# Patient Record
Sex: Male | Born: 1990 | Hispanic: No | Marital: Married | Smoking: Never smoker
Health system: Southern US, Community
[De-identification: ages and names within clinical notes are randomized; demographics above are authoritative.]

---
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
## 2012-07-21 ENCOUNTER — Encounter (HOSPITAL_COMMUNITY): Payer: Self-pay | Admitting: Emergency Medicine

## 2012-07-21 ENCOUNTER — Emergency Department (HOSPITAL_COMMUNITY)
Admission: EM | Admit: 2012-07-21 | Discharge: 2012-07-21 | Disposition: A | Discharge status: Home / Self Care | Payer: Medicaid Other | Source: Home / Self Care | Attending: Emergency Medicine | Admitting: Emergency Medicine

## 2012-07-21 DIAGNOSIS — L03039 Cellulitis of unspecified toe: Secondary | ICD-10-CM

## 2012-07-21 DIAGNOSIS — L03032 Cellulitis of left toe: Secondary | ICD-10-CM

## 2012-07-21 MED ORDER — DOXYCYCLINE HYCLATE 100 MG PO CAPS: 100.0000 mg | ORAL_CAPSULE | Freq: Two times a day (BID) | ORAL | Status: AC

## 2012-07-21 NOTE — ED Notes (Signed)
Pt c/o infection of great left toe x 2 days. Pain with wear shoes. Pt states was wearing tight shoes to work and pain started.  Pt has been cleaning toe and using lotion.

## 2012-07-21 NOTE — ED Provider Notes (Addendum)
History     CSN: [id number]  Arrival date & time 07/21/12  1633   First MD Initiated Contact with Patient 07/21/12 1800      Chief Complaint  Patient presents with  . Toe Pain    infection of left great toe. some discoloration. denies drainage. pt has used lotion and keeping area clean    (Consider location/radiation/quality/duration/timing/severity/associated sxs/prior treatment) HPI Comments: Patient presents urgent care describing that after having were some tight shoes started developing pain redness tenderness of his great left toe appeared in the last 2-3 days. Since then he has kept his toe out without any close to where. Has seen some purulent type discharge from the edge of his left thumb. Hurts to walk on it and touch appeared has been cleaning it is much as he can't and using a cream or lotion doesn't seem to be getting any better.  Patient is a 22 y.o. male presenting with toe pain. The history is provided by the patient. The history is limited by a language barrier. A language interpreter was used.  Toe Pain This is a new problem. The current episode started 2 days ago. The problem occurs constantly. The problem has not changed since onset.Pertinent negatives include no chest pain and no abdominal pain. The symptoms are aggravated by bending and walking. The symptoms are relieved by rest. He has tried nothing (cleaning area and using lotion) for the symptoms. The treatment provided no relief.    History reviewed. No pertinent past medical history.  Past Surgical History  Procedure Laterality Date  . Tonsillectomy    . Foot surgery      History reviewed. No pertinent family history.  History  Substance Use Topics  . Smoking status: Not on file  . Smokeless tobacco: Not on file  . Alcohol Use: No      Review of Systems  Constitutional: Negative for fever, chills, activity change and appetite change.  Cardiovascular: Negative for chest pain.  Gastrointestinal:  Negative for abdominal pain.  Skin: Positive for color change. Negative for pallor, rash and wound.  Neurological: Negative for weakness and numbness.    Allergies  Review of patient's allergies indicates no known allergies.  Home Medications   Current Outpatient Rx  Name  Route  Sig  Dispense  Refill  . doxycycline (VIBRAMYCIN) 100 MG capsule   Oral   Take 1 capsule (100 mg total) by mouth 2 (two) times daily.   14 capsule   0     BP 117/98  Pulse 60  Temp(Src) 97.8 F (36.6 C) (Oral)  Resp 18  SpO2 100%  Physical Exam  Constitutional: He appears well-developed and well-nourished.  Musculoskeletal: He exhibits tenderness.       Feet:  Neurological: He is alert.  Skin: No rash noted. There is erythema.    ED Course  Procedures (including critical care time)  Labs Reviewed - No data to display No results found.   1. Paronychia of great toe, left       MDM  Toe paronychia- Post-op shoe- Frequent soaks- of affected toe in soap andy-luke water- Doxycycline course 7 days To sue post-op shoe for 3 days.Podiatrist referral if discomfort persist after 7 days         Jimmie Molly, MD 07/21/12 1827  Jimmie Molly, MD 07/21/12 [phone]

## 2012-10-06 ENCOUNTER — Emergency Department (HOSPITAL_COMMUNITY)
Admission: EM | Admit: 2012-10-06 | Discharge: 2012-10-06 | Disposition: A | Discharge status: Home / Self Care | Payer: Medicaid Other | Source: Home / Self Care | Attending: Family Medicine | Admitting: Family Medicine

## 2012-10-06 ENCOUNTER — Encounter (HOSPITAL_COMMUNITY): Payer: Self-pay | Admitting: *Deleted

## 2012-10-06 DIAGNOSIS — J302 Other seasonal allergic rhinitis: Secondary | ICD-10-CM

## 2012-10-06 DIAGNOSIS — J309 Allergic rhinitis, unspecified: Secondary | ICD-10-CM

## 2012-10-06 MED ORDER — FLUTICASONE PROPIONATE 50 MCG/ACT NA SUSP: 1.0000 | Freq: Two times a day (BID) | NASAL | Status: DC

## 2012-10-06 MED ORDER — PREDNISONE 50 MG PO TABS: ORAL_TABLET | ORAL | Status: DC

## 2012-10-06 MED ORDER — TRIAMCINOLONE ACETONIDE 40 MG/ML IJ SUSP
40.0000 mg | Freq: Once | INTRAMUSCULAR | Status: AC
  Administered 2012-10-06: 40 mg via INTRAMUSCULAR

## 2012-10-06 MED ORDER — TRIAMCINOLONE ACETONIDE 40 MG/ML IJ SUSP
INTRAMUSCULAR | Status: AC
  Filled 2012-10-06: qty 1

## 2012-10-06 MED ORDER — FEXOFENADINE HCL 180 MG PO TABS: 180.0000 mg | ORAL_TABLET | Freq: Every day | ORAL | Status: DC

## 2012-10-06 NOTE — ED Provider Notes (Signed)
History    CSN: [id number] Arrival date & time 10/06/12  1619  First MD Initiated Contact with Patient 10/06/12 1631     Chief Complaint  Patient presents with  . Facial Pain   (Consider location/radiation/quality/duration/timing/severity/associated sxs/prior Treatment) Patient is a 22 y.o. male presenting with URI. The history is provided by the patient.  URI Presenting symptoms: congestion, facial pain and rhinorrhea   Presenting symptoms: no fever   Severity:  Moderate Onset quality:  Gradual Duration:  12 months Progression:  Worsening (in [place] from [place] for 6 mo.) Chronicity:  New Relieved by:  Nothing Associated symptoms: headaches    History reviewed. No pertinent past medical history. Past Surgical History  Procedure Laterality Date  . Tonsillectomy    . Foot surgery     History reviewed. No pertinent family history. History  Substance Use Topics  . Smoking status: Never Smoker   . Smokeless tobacco: Not on file  . Alcohol Use: No    Review of Systems  Constitutional: Negative.  Negative for fever.  HENT: Positive for nosebleeds, congestion, rhinorrhea and postnasal drip.   Respiratory: Positive for shortness of breath. Negative for chest tightness.   Cardiovascular: Negative.   Neurological: Positive for headaches.    Allergies  Review of patient's allergies indicates no known allergies.  Home Medications   Current Outpatient Rx  Name  Route  Sig  Dispense  Refill  . diphenhydrAMINE (BENADRYL) 25 MG tablet   Oral   Take 25 mg by mouth every 6 (six) hours as needed for itching or allergies.         Marland Kitchen ibuprofen (ADVIL,MOTRIN) 200 MG tablet   Oral   Take 400 mg by mouth every 6 (six) hours as needed for pain or headache.         . fexofenadine (ALLEGRA) 180 MG tablet   Oral   Take 1 tablet (180 mg total) by mouth daily.   30 tablet   1   . fluticasone (FLONASE) 50 MCG/ACT nasal spray   Nasal   Place 1 spray into the nose 2 (two) times  daily.   1 g   2   . predniSONE (DELTASONE) 50 MG tablet      1 tab daily for 2 days then 1/2 tab daily for 2 days. Start on Monday 7/21   3 tablet   0    BP 122/60  Pulse 60  Temp(Src) 99.9 F (37.7 C) (Oral)  Resp 18  SpO2 100% Physical Exam  Nursing note and vitals reviewed. Constitutional: He is oriented to person, place, and time. He appears well-developed and well-nourished. No distress.  HENT:  Head: Normocephalic.  Right Ear: External ear normal.  Left Ear: External ear normal.  Nose: Mucosal edema and rhinorrhea present. No epistaxis. Right sinus exhibits frontal sinus tenderness. Left sinus exhibits frontal sinus tenderness.  Mouth/Throat: Oropharynx is clear and moist.  Eyes: Conjunctivae and EOM are normal. Pupils are equal, round, and reactive to light.  Neck: Normal range of motion. Neck supple.  Cardiovascular: Normal rate, regular rhythm, normal heart sounds and intact distal pulses.   Pulmonary/Chest: Effort normal and breath sounds normal.  Lymphadenopathy:    He has no cervical adenopathy.  Neurological: He is alert and oriented to person, place, and time.  Skin: Skin is warm and dry.    ED Course  Procedures (including critical care time) Labs Reviewed - No data to display No results found. 1. Seasonal allergic rhinitis  MDM    Linna Hoff, MD 10/06/12 [phone]

## 2012-10-06 NOTE — ED Notes (Addendum)
C/o sinus headache in his forehead, sinus congestion, and "can't breathe when he is walking or exercising,"  No fever. No acute resp. distress noted.

## 2012-10-09 ENCOUNTER — Ambulatory Visit: Payer: Medicaid Other | Attending: Family Medicine | Admitting: Internal Medicine

## 2012-10-09 VITALS — BP 125/73 | HR 66 | Temp 97.0°F | Resp 18 | Wt 167.6 lb

## 2012-10-09 DIAGNOSIS — J302 Other seasonal allergic rhinitis: Secondary | ICD-10-CM

## 2012-10-09 DIAGNOSIS — J309 Allergic rhinitis, unspecified: Secondary | ICD-10-CM

## 2012-10-09 DIAGNOSIS — Z0289 Encounter for other administrative examinations: Secondary | ICD-10-CM

## 2012-10-09 NOTE — Progress Notes (Signed)
Per interpreter line patient is here for sinus issue Was seen at urgent care

## 2012-10-09 NOTE — Progress Notes (Signed)
Patient ID: Timothy Burton, male   DOB: [birth year], 22 y.o.   MRN: [id number]   HPI: Pt here to establish care- from [place], [place]. Work in [employer]. Recently went to urgent care for treatment of a sinus infection and was referred here.   No Known Allergies History reviewed. No pertinent past medical history. Prior to Admission medications   Medication Sig Start Date End Date Taking? Authorizing Provider  diphenhydrAMINE (BENADRYL) 25 MG tablet Take 25 mg by mouth every 6 (six) hours as needed for itching or allergies.    Historical Provider, MD  fexofenadine (ALLEGRA) 180 MG tablet Take 1 tablet (180 mg total) by mouth daily. 10/06/12   Linna Hoff, MD  fluticasone (FLONASE) 50 MCG/ACT nasal spray Place 1 spray into the nose 2 (two) times daily. 10/06/12   Linna Hoff, MD  ibuprofen (ADVIL,MOTRIN) 200 MG tablet Take 400 mg by mouth every 6 (six) hours as needed for pain or headache.    Historical Provider, MD  predniSONE (DELTASONE) 50 MG tablet 1 tab daily for 2 days then 1/2 tab daily for 2 days. Start on Monday 7/21 10/06/12   Linna Hoff, MD   History reviewed. No pertinent family history. History   Social History  . Marital Status: Married    Spouse Name: N/A    Number of Children: N/A  . Years of Education: N/A   Occupational History  . Not on file.   Social History Main Topics  . Smoking status: Never Smoker   . Smokeless tobacco: Not on file  . Alcohol Use: No  . Drug Use: No  . Sexually Active: Yes   Other Topics Concern  . Not on file   Social History Narrative  . No narrative on file    Review of Systems ______ Constitutional: Negative for fever, chills, diaphoresis, activity change, appetite change and fatigue. ____ HENT: Negative for ear pain, nosebleeds, congestion, facial swelling, rhinorrhea, neck pain, neck stiffness and ear discharge.  ____ Eyes: Negative for pain, discharge, redness, itching and visual disturbance. ____ Respiratory: Negative  for cough, choking, chest tightness, shortness of breath, wheezing and stridor.  ____ Cardiovascular: Negative for chest pain, palpitations and leg swelling. ____ Gastrointestinal: Negative for Nausea/ Vomiting/ Diarrhea or Consitpation Genitourinary: Negative for dysuria, urgency, frequency, hematuria, flank pain, decreased urine volume, difficulty urinating and dyspareunia. ____ Musculoskeletal: Negative for back pain, joint swelling, arthralgias and gait problem. ________ Neurological: Negative for dizziness, tremors, seizures, syncope, facial asymmetry, speech difficulty, weakness, light-headedness, numbness and headaches. ____ Hematological: Negative for adenopathy. Does not bruise/bleed easily. ____ Psychiatric/Behavioral: Negative for hallucinations, behavioral problems, confusion, dysphoric mood, decreased concentration and agitation. ______   Objective:   Filed Vitals:   10/09/12 1308  BP: 125/73  Pulse: 66  Temp: 97 F (36.1 C)  Resp: 18    Physical Exam ______ Constitutional: Appears well-developed and well-nourished. No distress. ____ HENT: Normocephalic. External right and left ear normal. Oropharynx is clear and moist. ____ Eyes: Conjunctivae and EOM are normal. PERRLA, no scleral icterus. ____ Neck: Normal ROM. Neck supple. No JVD. No tracheal deviation. No thyromegaly. ____ CVS: RRR, S1/S2 +, no murmurs, no gallops, no carotid bruit.  Pulmonary: Effort and breath sounds normal, no stridor, rhonchi, wheezes, rales.  Abdominal: Soft. BS +,  no distension, tenderness, rebound or guarding. ________ Musculoskeletal: Normal range of motion. No edema and no tenderness. ____ Lymphadenopathy: No lymphadenopathy noted, cervical, inguinal. Neuro: Alert. Normal reflexes, muscle tone coordination. No cranial nerve deficit. Skin:  Skin is warm and dry. No rash noted. Not diaphoretic. No erythema. No pallor. ____ Psychiatric: Normal mood and affect. Behavior, judgment, thought  content normal. __  No results found for this basename: WBC,  HGB,  HCT,  MCV,  PLT   No results found for this basename: CREATININE,  BUN,  NA,  K,  CL,  CO2    No results found for this basename: HGBA1C   Lipid Panel  No results found for this basename: chol,  trig,  hdl,  cholhdl,  vldl,  ldlcalc       Assessment and plan:   Patient Active Problem List   Diagnosis Date Noted  . Seasonal allergic rhinitis 10/09/2012    Seasonal allergic rhinitis: cont treatment as ordered by urgent care- return if symptoms persist after completion of treatment.

## 2012-11-09 ENCOUNTER — Emergency Department (INDEPENDENT_AMBULATORY_CARE_PROVIDER_SITE_OTHER)
Admission: EM | Admit: 2012-11-09 | Discharge: 2012-11-09 | Disposition: A | Discharge status: Home / Self Care | Payer: Medicaid Other | Source: Home / Self Care

## 2012-11-09 ENCOUNTER — Encounter (HOSPITAL_COMMUNITY): Payer: Self-pay | Admitting: *Deleted

## 2012-11-09 ENCOUNTER — Emergency Department (INDEPENDENT_AMBULATORY_CARE_PROVIDER_SITE_OTHER): Payer: Medicaid Other

## 2012-11-09 DIAGNOSIS — M549 Dorsalgia, unspecified: Secondary | ICD-10-CM

## 2012-11-09 DIAGNOSIS — M25512 Pain in left shoulder: Secondary | ICD-10-CM

## 2012-11-09 DIAGNOSIS — M25519 Pain in unspecified shoulder: Secondary | ICD-10-CM

## 2012-11-09 LAB — POCT URINALYSIS DIP (DEVICE)
Leukocytes, UA: NEGATIVE
Nitrite: NEGATIVE
Protein, ur: 30 mg/dL — AB
Urobilinogen, UA: 0.2 mg/dL (ref 0.0–1.0)

## 2012-11-09 LAB — URINALYSIS, ROUTINE W REFLEX MICROSCOPIC
Ketones, ur: NEGATIVE mg/dL
Leukocytes, UA: NEGATIVE
Nitrite: NEGATIVE
Protein, ur: NEGATIVE mg/dL

## 2012-11-09 MED ORDER — HYDROCODONE-ACETAMINOPHEN 5-325 MG PO TABS: 2.0000 | ORAL_TABLET | ORAL | Status: DC | PRN

## 2012-11-09 MED ORDER — IBUPROFEN 800 MG PO TABS: 800.0000 mg | ORAL_TABLET | Freq: Three times a day (TID) | ORAL | Status: DC

## 2012-11-09 NOTE — ED Provider Notes (Signed)
Medical screening examination/treatment/procedure(s) were performed by a resident physician or non-physician practitioner and as the supervising physician I was immediately available for consultation/collaboration.  Kathyann Spaugh, MD   Jomel Whittlesey S Taylin Mans, MD 11/09/12 1958 

## 2012-11-09 NOTE — ED Notes (Signed)
Attempted to call x 2 for urinalysis results. There was not an answer
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
## 2012-11-12 ENCOUNTER — Emergency Department (HOSPITAL_COMMUNITY)
Admission: EM | Admit: 2012-11-12 | Discharge: 2012-11-12 | Disposition: A | Discharge status: Home / Self Care | Payer: No Typology Code available for payment source | Attending: Emergency Medicine | Admitting: Emergency Medicine

## 2012-11-12 ENCOUNTER — Encounter (HOSPITAL_COMMUNITY): Payer: Self-pay | Admitting: Emergency Medicine

## 2012-11-12 ENCOUNTER — Emergency Department (HOSPITAL_COMMUNITY): Payer: No Typology Code available for payment source

## 2012-11-12 DIAGNOSIS — R112 Nausea with vomiting, unspecified: Secondary | ICD-10-CM | POA: Insufficient documentation

## 2012-11-12 DIAGNOSIS — Y9389 Activity, other specified: Secondary | ICD-10-CM | POA: Diagnosis not present

## 2012-11-12 DIAGNOSIS — M7918 Myalgia, other site: Secondary | ICD-10-CM

## 2012-11-12 DIAGNOSIS — S0990XA Unspecified injury of head, initial encounter: Secondary | ICD-10-CM | POA: Insufficient documentation

## 2012-11-12 DIAGNOSIS — Y92410 Unspecified street and highway as the place of occurrence of the external cause: Secondary | ICD-10-CM | POA: Diagnosis not present

## 2012-11-12 DIAGNOSIS — IMO0001 Reserved for inherently not codable concepts without codable children: Secondary | ICD-10-CM | POA: Diagnosis not present

## 2012-11-12 MED ORDER — ONDANSETRON 4 MG PO TBDP
4.0000 mg | ORAL_TABLET | Freq: Once | ORAL | Status: AC
  Administered 2012-11-12: 4 mg via ORAL
  Filled 2012-11-12: qty 1

## 2012-11-12 MED ORDER — MORPHINE SULFATE 4 MG/ML IJ SOLN
4.0000 mg | Freq: Once | INTRAMUSCULAR | Status: AC
  Administered 2012-11-12: 4 mg via INTRAMUSCULAR
  Filled 2012-11-12: qty 1

## 2012-11-12 MED ORDER — PROMETHAZINE HCL 25 MG PO TABS: 25.0000 mg | ORAL_TABLET | Freq: Four times a day (QID) | ORAL | Status: DC | PRN
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
## 2012-11-13 NOTE — ED Provider Notes (Signed)
Medical screening examination/treatment/procedure(s) were performed by non-physician practitioner and as supervising physician I was immediately available for consultation/collaboration.   Glynn Octave, MD 11/13/12 [phone]

## 2012-11-15 ENCOUNTER — Ambulatory Visit: Payer: No Typology Code available for payment source | Attending: Family Medicine | Admitting: Internal Medicine

## 2012-11-15 VITALS — BP 125/81 | HR 85 | Temp 99.2°F | Resp 16 | Ht 69.29 in | Wt 160.2 lb

## 2012-11-15 DIAGNOSIS — R200 Anesthesia of skin: Secondary | ICD-10-CM

## 2012-11-15 DIAGNOSIS — S139XXA Sprain of joints and ligaments of unspecified parts of neck, initial encounter: Secondary | ICD-10-CM

## 2012-11-15 DIAGNOSIS — R209 Unspecified disturbances of skin sensation: Secondary | ICD-10-CM

## 2012-11-15 DIAGNOSIS — S134XXA Sprain of ligaments of cervical spine, initial encounter: Secondary | ICD-10-CM

## 2012-11-15 MED ORDER — CYCLOBENZAPRINE HCL 10 MG PO TABS: 10.0000 mg | ORAL_TABLET | Freq: Three times a day (TID) | ORAL | Status: DC | PRN

## 2012-11-15 MED ORDER — TRAMADOL HCL 50 MG PO TABS: 50.0000 mg | ORAL_TABLET | Freq: Three times a day (TID) | ORAL | Status: DC | PRN

## 2012-11-15 MED ORDER — PREDNISONE 10 MG PO TABS: 10.0000 mg | ORAL_TABLET | Freq: Every day | ORAL | Status: DC
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
## 2012-11-19 ENCOUNTER — Telehealth: Payer: Self-pay

## 2012-11-19 NOTE — Telephone Encounter (Signed)
Patient is aware he has an MRI scheduled for 11/25/12 9 am to arrive by 8:45

## 2012-11-25 ENCOUNTER — Ambulatory Visit (HOSPITAL_COMMUNITY)
Admission: RE | Admit: 2012-11-25 | Discharge: 2012-11-25 | Disposition: A | Discharge status: Home / Self Care | Payer: No Typology Code available for payment source | Source: Ambulatory Visit | Attending: Internal Medicine | Admitting: Internal Medicine

## 2012-11-25 ENCOUNTER — Telehealth: Payer: Self-pay | Admitting: Emergency Medicine

## 2012-11-25 DIAGNOSIS — M5020 Other cervical disc displacement, unspecified cervical region: Secondary | ICD-10-CM | POA: Insufficient documentation

## 2012-11-25 DIAGNOSIS — S134XXA Sprain of ligaments of cervical spine, initial encounter: Secondary | ICD-10-CM

## 2012-11-25 DIAGNOSIS — R200 Anesthesia of skin: Secondary | ICD-10-CM

## 2012-11-25 DIAGNOSIS — R29898 Other symptoms and signs involving the musculoskeletal system: Secondary | ICD-10-CM | POA: Insufficient documentation

## 2012-11-25 DIAGNOSIS — R209 Unspecified disturbances of skin sensation: Secondary | ICD-10-CM | POA: Insufficient documentation

## 2012-11-25 DIAGNOSIS — M542 Cervicalgia: Secondary | ICD-10-CM | POA: Diagnosis present

## 2012-12-02 ENCOUNTER — Telehealth: Payer: Self-pay | Admitting: Emergency Medicine

## 2012-12-03 ENCOUNTER — Ambulatory Visit: Payer: No Typology Code available for payment source | Attending: Internal Medicine | Admitting: Internal Medicine

## 2012-12-03 ENCOUNTER — Encounter: Payer: Self-pay | Admitting: Internal Medicine

## 2012-12-03 VITALS — BP 122/80 | HR 59 | Temp 98.7°F | Resp 16 | Ht 69.0 in | Wt 165.0 lb

## 2012-12-03 DIAGNOSIS — R200 Anesthesia of skin: Secondary | ICD-10-CM

## 2012-12-03 DIAGNOSIS — R209 Unspecified disturbances of skin sensation: Secondary | ICD-10-CM

## 2012-12-03 DIAGNOSIS — R51 Headache: Secondary | ICD-10-CM | POA: Diagnosis present

## 2012-12-03 DIAGNOSIS — S134XXA Sprain of ligaments of cervical spine, initial encounter: Secondary | ICD-10-CM

## 2012-12-03 DIAGNOSIS — R519 Headache, unspecified: Secondary | ICD-10-CM | POA: Insufficient documentation

## 2012-12-03 DIAGNOSIS — S139XXA Sprain of joints and ligaments of unspecified parts of neck, initial encounter: Secondary | ICD-10-CM

## 2012-12-03 MED ORDER — TRAMADOL HCL 50 MG PO TABS: 50.0000 mg | ORAL_TABLET | Freq: Three times a day (TID) | ORAL | Status: DC | PRN

## 2012-12-03 NOTE — Progress Notes (Signed)
Pt is here for a F/U visit. Pt is still having pain in his back , neck ,left side and shoulders causing him headaches and dizziness.

## 2012-12-03 NOTE — Patient Instructions (Signed)
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
## 2012-12-10 ENCOUNTER — Ambulatory Visit: Payer: Self-pay | Admitting: Neurology

## 2012-12-11 ENCOUNTER — Ambulatory Visit: Payer: Medicaid Other

## 2012-12-11 ENCOUNTER — Encounter: Payer: Self-pay | Admitting: Neurology

## 2012-12-11 ENCOUNTER — Ambulatory Visit (INDEPENDENT_AMBULATORY_CARE_PROVIDER_SITE_OTHER): Payer: Medicaid Other | Admitting: Neurology

## 2012-12-11 VITALS — BP 125/78 | HR 74 | Ht 70.0 in | Wt 171.0 lb

## 2012-12-11 DIAGNOSIS — M7918 Myalgia, other site: Secondary | ICD-10-CM

## 2012-12-11 DIAGNOSIS — IMO0001 Reserved for inherently not codable concepts without codable children: Secondary | ICD-10-CM

## 2012-12-11 MED ORDER — TIZANIDINE HCL 2 MG PO CAPS
ORAL_CAPSULE | ORAL | Status: DC
Start: 2012-12-11 — End: 2013-11-27
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
## 2012-12-11 NOTE — Patient Instructions (Addendum)
Overall you are doing fairly well but I do want to suggest a few things today:   Remember to drink plenty of fluid, eat healthy meals and do not skip any meals. Try to eat protein with a every meal and eat a healthy snack such as fruit or nuts in between meals. Try to keep a regular sleep-wake schedule and try to exercise daily, particularly in the form of walking, 20-30 minutes a day, if you can.   As far as your medications are concerned, I would like to suggest a muscle relaxant to help relieve some of your pain.  We will refer you to physical therapy  We will see you back as needed. Please call us with any interim questions, concerns, problems, updates or refill requests.   Please also call us for any test results so we can go over those with you on the phone.  My clinical assistant and will answer any of your questions and relay your messages to me and also relay most of my messages to you.   Our phone number is [phone]. We also have an after hours call service for urgent matters and there is a physician on-call for urgent questions. For any emergencies you know to call 911 or go to the nearest emergency room

## 2012-12-18 ENCOUNTER — Other Ambulatory Visit: Payer: Self-pay | Admitting: Internal Medicine

## 2012-12-18 ENCOUNTER — Telehealth: Payer: Self-pay | Admitting: Neurology

## 2012-12-18 NOTE — Telephone Encounter (Signed)
I called the pharmacy.  Spoke with the pharmacist.  He said the insurance does cover Tizanidine, and in fact, the patient already picked up the med, and paid the $3 co-pay. He said the patient is waiting for authorization on Tramadol from another provider, so he must be confused about the medication.  They will contact the other provider regarding Tramadol and will follow up with the patient regarding this.

## 2012-12-23 NOTE — Telephone Encounter (Signed)
Medication refill-ultram 

## 2013-05-21 ENCOUNTER — Emergency Department: Payer: Self-pay | Admitting: Emergency Medicine

## 2013-11-27 ENCOUNTER — Emergency Department (HOSPITAL_COMMUNITY): Payer: No Typology Code available for payment source

## 2013-11-27 ENCOUNTER — Emergency Department (HOSPITAL_COMMUNITY)
Admission: EM | Admit: 2013-11-27 | Discharge: 2013-11-28 | Disposition: A | Discharge status: Home / Self Care | Payer: No Typology Code available for payment source | Attending: Emergency Medicine | Admitting: Emergency Medicine

## 2013-11-27 ENCOUNTER — Encounter (HOSPITAL_COMMUNITY): Payer: Self-pay | Admitting: Emergency Medicine

## 2013-11-27 DIAGNOSIS — S335XXA Sprain of ligaments of lumbar spine, initial encounter: Secondary | ICD-10-CM | POA: Insufficient documentation

## 2013-11-27 DIAGNOSIS — S139XXA Sprain of joints and ligaments of unspecified parts of neck, initial encounter: Secondary | ICD-10-CM | POA: Diagnosis not present

## 2013-11-27 DIAGNOSIS — Y9389 Activity, other specified: Secondary | ICD-10-CM | POA: Diagnosis not present

## 2013-11-27 DIAGNOSIS — S79929A Unspecified injury of unspecified thigh, initial encounter: Secondary | ICD-10-CM

## 2013-11-27 DIAGNOSIS — R269 Unspecified abnormalities of gait and mobility: Secondary | ICD-10-CM | POA: Diagnosis not present

## 2013-11-27 DIAGNOSIS — S3981XA Other specified injuries of abdomen, initial encounter: Secondary | ICD-10-CM | POA: Diagnosis not present

## 2013-11-27 DIAGNOSIS — S161XXA Strain of muscle, fascia and tendon at neck level, initial encounter: Secondary | ICD-10-CM

## 2013-11-27 DIAGNOSIS — IMO0002 Reserved for concepts with insufficient information to code with codable children: Secondary | ICD-10-CM | POA: Insufficient documentation

## 2013-11-27 DIAGNOSIS — Y92410 Unspecified street and highway as the place of occurrence of the external cause: Secondary | ICD-10-CM | POA: Diagnosis not present

## 2013-11-27 DIAGNOSIS — S79919A Unspecified injury of unspecified hip, initial encounter: Secondary | ICD-10-CM | POA: Diagnosis not present

## 2013-11-27 DIAGNOSIS — S39012A Strain of muscle, fascia and tendon of lower back, initial encounter: Secondary | ICD-10-CM

## 2013-11-27 MED ORDER — MORPHINE SULFATE 4 MG/ML IJ SOLN
6.0000 mg | Freq: Once | INTRAMUSCULAR | Status: AC
  Administered 2013-11-27: 6 mg via INTRAVENOUS
  Filled 2013-11-27: qty 2

## 2013-11-27 MED ORDER — IOHEXOL 300 MG/ML  SOLN
100.0000 mL | Freq: Once | INTRAMUSCULAR | Status: AC | PRN
  Administered 2013-11-27: 100 mL via INTRAVENOUS

## 2013-11-27 NOTE — ED Notes (Signed)
Pt monitored by pulse ox, bp cuff, and 12-lead. 
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
## 2013-11-27 NOTE — ED Notes (Signed)
PT was able to urinate on his own after a lot of time trying.

## 2013-11-28 ENCOUNTER — Emergency Department (HOSPITAL_COMMUNITY): Payer: No Typology Code available for payment source

## 2013-11-28 LAB — CBC WITH DIFFERENTIAL/PLATELET
Basophils Absolute: 0 10*3/uL (ref 0.0–0.1)
Basophils Relative: 0 % (ref 0–1)
Eosinophils Absolute: 0.2 10*3/uL (ref 0.0–0.7)
Eosinophils Relative: 3 % (ref 0–5)
HCT: 42.1 % (ref 39.0–52.0)
Hemoglobin: 14.2 g/dL (ref 13.0–17.0)
LYMPHS ABS: 1.9 10*3/uL (ref 0.7–4.0)
LYMPHS PCT: 28 % (ref 12–46)
MCH: 26.4 pg (ref 26.0–34.0)
MCHC: 33.7 g/dL (ref 30.0–36.0)
MCV: 78.4 fL (ref 78.0–100.0)
Monocytes Absolute: 0.6 10*3/uL (ref 0.1–1.0)
Monocytes Relative: 9 % (ref 3–12)
NEUTROS ABS: 4.1 10*3/uL (ref 1.7–7.7)
Neutrophils Relative %: 60 % (ref 43–77)
Platelets: 142 10*3/uL — ABNORMAL LOW (ref 150–400)
RBC: 5.37 MIL/uL (ref 4.22–5.81)
RDW: 13.5 % (ref 11.5–15.5)
WBC: 6.7 10*3/uL (ref 4.0–10.5)

## 2013-11-28 LAB — COMPREHENSIVE METABOLIC PANEL
ALK PHOS: 54 U/L (ref 39–117)
ALT: 57 U/L — AB (ref 0–53)
AST: 53 U/L — ABNORMAL HIGH (ref 0–37)
Albumin: 4 g/dL (ref 3.5–5.2)
Anion gap: 13 (ref 5–15)
BUN: 12 mg/dL (ref 6–23)
CHLORIDE: 108 meq/L (ref 96–112)
CO2: 25 mEq/L (ref 19–32)
Calcium: 8.7 mg/dL (ref 8.4–10.5)
Creatinine, Ser: 0.9 mg/dL (ref 0.50–1.35)
GFR calc non Af Amer: >90 mL/min (ref >90)
GLUCOSE: 115 mg/dL — AB (ref 70–99)
POTASSIUM: 3.6 meq/L — AB (ref 3.7–5.3)
Sodium: 146 mEq/L (ref 137–147)
Total Bilirubin: 0.5 mg/dL (ref 0.3–1.2)
Total Protein: 6.5 g/dL (ref 6.0–8.3)

## 2013-11-28 MED ORDER — OXYCODONE-ACETAMINOPHEN 5-325 MG PO TABS: 1.0000 | ORAL_TABLET | Freq: Four times a day (QID) | ORAL | Status: DC | PRN

## 2013-11-28 MED ORDER — CYCLOBENZAPRINE HCL 10 MG PO TABS: 10.0000 mg | ORAL_TABLET | Freq: Three times a day (TID) | ORAL | Status: DC | PRN

## 2013-11-28 MED ORDER — MORPHINE SULFATE 4 MG/ML IJ SOLN
6.0000 mg | Freq: Once | INTRAMUSCULAR | Status: AC
  Administered 2013-11-28: 6 mg via INTRAVENOUS
  Filled 2013-11-28: qty 2

## 2013-11-28 MED ORDER — DIAZEPAM 5 MG/ML IJ SOLN
5.0000 mg | Freq: Once | INTRAMUSCULAR | Status: AC
  Administered 2013-11-28: 5 mg via INTRAVENOUS
  Filled 2013-11-28: qty 2

## 2013-11-28 MED ORDER — ONDANSETRON HCL 4 MG/2ML IJ SOLN
4.0000 mg | Freq: Once | INTRAMUSCULAR | Status: DC
  Filled 2013-11-28: qty 2

## 2013-11-28 MED ORDER — ONDANSETRON HCL 4 MG/2ML IJ SOLN
8.0000 mg | Freq: Once | INTRAMUSCULAR | Status: AC
  Administered 2013-11-28: 8 mg via INTRAMUSCULAR
  Filled 2013-11-28: qty 4

## 2013-11-28 MED ORDER — SODIUM CHLORIDE 0.9 % IV BOLUS (SEPSIS)
1000.0000 mL | Freq: Once | INTRAVENOUS | Status: AC
  Administered 2013-11-28: 1000 mL via INTRAVENOUS

## 2013-11-28 MED ORDER — KETOROLAC TROMETHAMINE 30 MG/ML IJ SOLN
30.0000 mg | Freq: Once | INTRAMUSCULAR | Status: AC
  Administered 2013-11-28: 30 mg via INTRAVENOUS
  Filled 2013-11-28: qty 1

## 2013-11-28 MED ORDER — HYDROMORPHONE HCL PF 1 MG/ML IJ SOLN
1.0000 mg | Freq: Once | INTRAMUSCULAR | Status: AC
  Administered 2013-11-28: 1 mg via INTRAVENOUS
  Filled 2013-11-28: qty 1

## 2013-11-28 MED ORDER — IBUPROFEN 800 MG PO TABS: 800.0000 mg | ORAL_TABLET | Freq: Three times a day (TID) | ORAL | Status: DC | PRN

## 2013-11-28 MED ORDER — LORAZEPAM 2 MG/ML IJ SOLN
0.5000 mg | Freq: Once | INTRAMUSCULAR | Status: AC
  Administered 2013-11-28: 0.5 mg via INTRAVENOUS
  Filled 2013-11-28: qty 1

## 2013-11-28 MED ORDER — ONDANSETRON HCL 4 MG/2ML IJ SOLN
4.0000 mg | Freq: Once | INTRAMUSCULAR | Status: AC
  Administered 2013-11-28: 4 mg via INTRAVENOUS
  Filled 2013-11-28: qty 2

## 2013-11-28 NOTE — ED Notes (Signed)
Pt N/V improved, Pt discharged

## 2013-11-28 NOTE — ED Notes (Signed)
Called radiologist to get approval for MRI staff to be called in. He reported that we usually do not call in staff for full spine MRIs. He requested to speak with the physician, Dr. Ranae Palms notified and given radiologist's number.

## 2013-11-28 NOTE — ED Notes (Signed)
Patient transported to MRI 

## 2013-11-28 NOTE — ED Notes (Signed)
Dr Patria Mane and Dr. Ranae Palms at the bedside.

## 2013-11-28 NOTE — ED Notes (Signed)
MD at bedside. Pt feels like his head is spinning, MD aware.
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
## 2013-11-28 NOTE — Discharge Instructions (Signed)
Ice packs to the injured or sore muscles for pain and use heat to help your muscles relax. . Take the medications for pain and muscle spasms. Return to the ED for any problems listed on the head injury sheet. Recheck if you aren't improving in the next week. I gave you the name of several back specialists or you can see the physician you were seeing last year when you had back problems.

## 2013-11-28 NOTE — ED Notes (Signed)
Pt noted to have N/V while getting into wheelchair to be discharged. MD notified

## 2013-11-28 NOTE — ED Notes (Addendum)
When attempting to ambulate pt, pt unable to stand. PA Lawyer notified. C-collar maintained.

## 2013-11-28 NOTE — ED Notes (Signed)
Pt reports difficulty Urinating. Pt bladder scanned which showed . MD [facility] notified.
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
## 2013-12-01 NOTE — ED Provider Notes (Signed)
Medical screening examination/treatment/procedure(s) were performed by non-physician practitioner and as supervising physician I was immediately available for consultation/collaboration.   EKG Interpretation   Date/Time:  Thursday November 27 2013 21:31:37 EDT Ventricular Rate:  109 PR Interval:  145 QRS Duration: 120 QT Interval:  339 QTC Calculation: 456 R Axis:   41 Text Interpretation:  Sinus tachycardia Probable left atrial enlargement  Nonspecific intraventricular conduction delay ED PHYSICIAN INTERPRETATION  AVAILABLE IN [facility] Confirmed by TEST, Record ([id number]) on  11/29/2013 4:47:51 PM        Townsend Cudworth N Rebekha Diveley, DO 12/01/13 1012

## 2013-12-09 ENCOUNTER — Inpatient Hospital Stay: Payer: Self-pay | Admitting: Internal Medicine

## 2014-03-24 ENCOUNTER — Encounter: Payer: Self-pay | Admitting: Neurology

## 2014-03-24 ENCOUNTER — Encounter: Payer: Self-pay | Admitting: Emergency Medicine

## 2014-08-24 ENCOUNTER — Ambulatory Visit: Payer: Self-pay | Attending: Internal Medicine

## 2016-08-14 ENCOUNTER — Emergency Department (HOSPITAL_COMMUNITY)
Admission: EM | Admit: 2016-08-14 | Discharge: 2016-08-14 | Disposition: A | Discharge status: Home / Self Care | Payer: Self-pay | Attending: Emergency Medicine | Admitting: Emergency Medicine

## 2016-08-14 ENCOUNTER — Encounter (HOSPITAL_COMMUNITY): Payer: Self-pay | Admitting: *Deleted

## 2016-08-14 DIAGNOSIS — L0291 Cutaneous abscess, unspecified: Secondary | ICD-10-CM

## 2016-08-14 DIAGNOSIS — L0231 Cutaneous abscess of buttock: Secondary | ICD-10-CM | POA: Insufficient documentation

## 2016-08-14 DIAGNOSIS — Z79899 Other long term (current) drug therapy: Secondary | ICD-10-CM | POA: Insufficient documentation

## 2016-08-14 MED ORDER — CEPHALEXIN 500 MG PO CAPS: 500.0000 mg | ORAL_CAPSULE | Freq: Four times a day (QID) | ORAL | 0 refills | Status: DC

## 2016-08-14 MED ORDER — LIDOCAINE HCL (PF) 1 % IJ SOLN
5.0000 mL | Freq: Once | INTRAMUSCULAR | Status: AC
  Administered 2016-08-14: 5 mL
  Filled 2016-08-14: qty 5

## 2016-08-14 MED ORDER — SULFAMETHOXAZOLE-TRIMETHOPRIM 800-160 MG PO TABS: 1.0000 | ORAL_TABLET | Freq: Two times a day (BID) | ORAL | 0 refills | Status: AC
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
## 2016-08-14 NOTE — ED Triage Notes (Signed)
Pt sent here for recurrent abscess to L buttock.

## 2016-08-14 NOTE — Discharge Instructions (Signed)
Take your antibiotics as prescribed until completed. You may continue applying warm compresses to area for 15-20 minutes 3-4 times daily. He may also take Tylenol or ibuprofen as prescribed over-the-counter as needed for pain relief. Follow-up with your primary care provider in the next 2-3 days for wound recheck. Return to the emergency department if symptoms worsen or new onset of fever, rectal pain, unable to have a bowel movement, blood in bowels, abdominal pain, vomiting, worsening redness/swelling.

## 2017-04-04 ENCOUNTER — Emergency Department (HOSPITAL_COMMUNITY): Payer: BLUE CROSS/BLUE SHIELD

## 2017-04-04 ENCOUNTER — Emergency Department (HOSPITAL_COMMUNITY)
Admission: EM | Admit: 2017-04-04 | Discharge: 2017-04-05 | Disposition: A | Discharge status: Home / Self Care | Payer: BLUE CROSS/BLUE SHIELD | Attending: Emergency Medicine | Admitting: Emergency Medicine

## 2017-04-04 ENCOUNTER — Other Ambulatory Visit: Payer: Self-pay

## 2017-04-04 ENCOUNTER — Encounter (HOSPITAL_COMMUNITY): Payer: Self-pay | Admitting: Emergency Medicine

## 2017-04-04 DIAGNOSIS — Z79899 Other long term (current) drug therapy: Secondary | ICD-10-CM | POA: Diagnosis not present

## 2017-04-04 DIAGNOSIS — J069 Acute upper respiratory infection, unspecified: Secondary | ICD-10-CM

## 2017-04-04 DIAGNOSIS — R509 Fever, unspecified: Secondary | ICD-10-CM

## 2017-04-04 DIAGNOSIS — E860 Dehydration: Secondary | ICD-10-CM | POA: Insufficient documentation

## 2017-04-04 LAB — CBC WITH DIFFERENTIAL/PLATELET
BASOS ABS: 0 10*3/uL (ref 0.0–0.1)
Basophils Relative: 0 %
EOS ABS: 0 10*3/uL (ref 0.0–0.7)
EOS PCT: 0 %
HCT: 40.5 % (ref 39.0–52.0)
HEMOGLOBIN: 13.2 g/dL (ref 13.0–17.0)
LYMPHS PCT: 8 %
Lymphs Abs: 0.5 10*3/uL — ABNORMAL LOW (ref 0.7–4.0)
MCH: 25.9 pg — ABNORMAL LOW (ref 26.0–34.0)
MCHC: 32.6 g/dL (ref 30.0–36.0)
MCV: 79.4 fL (ref 78.0–100.0)
Monocytes Absolute: 0.2 10*3/uL (ref 0.1–1.0)
Monocytes Relative: 3 %
NEUTROS PCT: 89 %
Neutro Abs: 4.9 10*3/uL (ref 1.7–7.7)
PLATELETS: 121 10*3/uL — AB (ref 150–400)
RBC: 5.1 MIL/uL (ref 4.22–5.81)
RDW: 13.2 % (ref 11.5–15.5)
WBC: 5.5 10*3/uL (ref 4.0–10.5)

## 2017-04-04 LAB — COMPREHENSIVE METABOLIC PANEL
ALBUMIN: 4 g/dL (ref 3.5–5.0)
ALK PHOS: 57 U/L (ref 38–126)
ALT: 18 U/L (ref 17–63)
AST: 21 U/L (ref 15–41)
Anion gap: 10 (ref 5–15)
BILIRUBIN TOTAL: 0.7 mg/dL (ref 0.3–1.2)
BUN: 18 mg/dL (ref 6–20)
CALCIUM: 8.5 mg/dL — AB (ref 8.9–10.3)
CO2: 23 mmol/L (ref 22–32)
CREATININE: 1.24 mg/dL (ref 0.61–1.24)
Chloride: 104 mmol/L (ref 101–111)
GFR calc Af Amer: >60 mL/min (ref >60)
GFR calc non Af Amer: >60 mL/min (ref >60)
Glucose, Bld: 109 mg/dL — ABNORMAL HIGH (ref 65–99)
POTASSIUM: 3.6 mmol/L (ref 3.5–5.1)
Sodium: 137 mmol/L (ref 135–145)
Total Protein: 6.5 g/dL (ref 6.5–8.1)

## 2017-04-04 LAB — PROTIME-INR
INR: 1.18
PROTHROMBIN TIME: 14.9 s (ref 11.4–15.2)

## 2017-04-04 LAB — I-STAT CG4 LACTIC ACID, ED
LACTIC ACID, VENOUS: 1.17 mmol/L (ref 0.5–1.9)
LACTIC ACID, VENOUS: 0.94 mmol/L (ref 0.5–1.9)

## 2017-04-04 MED ORDER — SODIUM CHLORIDE 0.9 % IV BOLUS (SEPSIS)
1000.0000 mL | Freq: Once | INTRAVENOUS | Status: AC
  Administered 2017-04-04: 1000 mL via INTRAVENOUS

## 2017-04-04 MED ORDER — KETOROLAC TROMETHAMINE 30 MG/ML IJ SOLN
30.0000 mg | Freq: Once | INTRAMUSCULAR | Status: AC
  Administered 2017-04-04: 30 mg via INTRAVENOUS
  Filled 2017-04-04: qty 1

## 2017-04-04 MED ORDER — ACETAMINOPHEN 500 MG PO TABS
1000.0000 mg | ORAL_TABLET | Freq: Once | ORAL | Status: AC
  Administered 2017-04-04: 1000 mg via ORAL
  Filled 2017-04-04: qty 2

## 2017-04-04 MED ORDER — IBUPROFEN 800 MG PO TABS: 800.0000 mg | ORAL_TABLET | Freq: Three times a day (TID) | ORAL | 0 refills | Status: DC | PRN

## 2017-04-04 NOTE — Discharge Instructions (Addendum)
Tylenol and ibuprofen as needed for fever. °
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
## 2017-04-04 NOTE — ED Triage Notes (Signed)
Patient with fever and dizziness.  Patient states that it started yesterday evening.  Patient did take some tylenol for his fever.  He also took some ibuprofen along with it.

## 2017-04-05 LAB — INFLUENZA PANEL BY PCR (TYPE A & B)
INFLAPCR: NEGATIVE
Influenza B By PCR: NEGATIVE

## 2017-04-05 LAB — URINALYSIS, ROUTINE W REFLEX MICROSCOPIC
BILIRUBIN URINE: NEGATIVE
Glucose, UA: NEGATIVE mg/dL
KETONES UR: 15 mg/dL — AB
Leukocytes, UA: NEGATIVE
NITRITE: NEGATIVE
Protein, ur: NEGATIVE mg/dL
pH: 5.5 (ref 5.0–8.0)

## 2017-04-05 LAB — URINALYSIS, MICROSCOPIC (REFLEX)
RBC / HPF: NONE SEEN RBC/hpf (ref 0–5)
WBC, UA: NONE SEEN WBC/hpf (ref 0–5)

## 2017-04-09 LAB — CULTURE, BLOOD (ROUTINE X 2)
CULTURE: NO GROWTH
SPECIAL REQUESTS: ADEQUATE

## 2017-04-10 LAB — CULTURE, BLOOD (ROUTINE X 2)
CULTURE: NO GROWTH
Special Requests: ADEQUATE

## 2017-10-23 ENCOUNTER — Other Ambulatory Visit (HOSPITAL_BASED_OUTPATIENT_CLINIC_OR_DEPARTMENT_OTHER): Payer: Self-pay

## 2017-10-23 DIAGNOSIS — G478 Other sleep disorders: Secondary | ICD-10-CM

## 2017-10-23 DIAGNOSIS — R5383 Other fatigue: Secondary | ICD-10-CM

## 2017-10-23 DIAGNOSIS — R0683 Snoring: Secondary | ICD-10-CM

## 2017-11-09 ENCOUNTER — Ambulatory Visit (HOSPITAL_BASED_OUTPATIENT_CLINIC_OR_DEPARTMENT_OTHER): Payer: BLUE CROSS/BLUE SHIELD | Attending: Internal Medicine

## 2018-11-27 ENCOUNTER — Other Ambulatory Visit: Payer: Self-pay

## 2018-11-27 DIAGNOSIS — Z20822 Contact with and (suspected) exposure to covid-19: Secondary | ICD-10-CM

## 2018-11-29 LAB — NOVEL CORONAVIRUS, NAA: SARS-CoV-2, NAA: NOT DETECTED

## 2019-10-16 ENCOUNTER — Encounter (HOSPITAL_COMMUNITY): Payer: Self-pay | Admitting: Emergency Medicine

## 2019-10-16 ENCOUNTER — Emergency Department (HOSPITAL_COMMUNITY): Payer: Self-pay

## 2019-10-16 ENCOUNTER — Other Ambulatory Visit: Payer: Self-pay

## 2019-10-16 ENCOUNTER — Emergency Department (HOSPITAL_COMMUNITY)
Admission: EM | Admit: 2019-10-16 | Discharge: 2019-10-17 | Disposition: A | Discharge status: Home / Self Care | Payer: Self-pay | Attending: Emergency Medicine | Admitting: Emergency Medicine

## 2019-10-16 DIAGNOSIS — N23 Unspecified renal colic: Secondary | ICD-10-CM | POA: Insufficient documentation

## 2019-10-16 DIAGNOSIS — N50811 Right testicular pain: Secondary | ICD-10-CM | POA: Insufficient documentation

## 2019-10-16 DIAGNOSIS — R000 Tachycardia, unspecified: Secondary | ICD-10-CM | POA: Insufficient documentation

## 2019-10-16 DIAGNOSIS — M549 Dorsalgia, unspecified: Secondary | ICD-10-CM | POA: Insufficient documentation

## 2019-10-16 LAB — URINALYSIS, ROUTINE W REFLEX MICROSCOPIC
Bilirubin Urine: NEGATIVE
Glucose, UA: NEGATIVE mg/dL
Ketones, ur: 20 mg/dL — AB
Leukocytes,Ua: NEGATIVE
Nitrite: NEGATIVE
Protein, ur: 30 mg/dL — AB
Specific Gravity, Urine: 1.03 (ref 1.005–1.030)
pH: 5 (ref 5.0–8.0)

## 2019-10-16 LAB — COMPREHENSIVE METABOLIC PANEL
ALT: 28 U/L (ref 0–44)
AST: 24 U/L (ref 15–41)
Albumin: 4.6 g/dL (ref 3.5–5.0)
Alkaline Phosphatase: 63 U/L (ref 38–126)
Anion gap: 11 (ref 5–15)
BUN: 22 mg/dL — ABNORMAL HIGH (ref 6–20)
CO2: 23 mmol/L (ref 22–32)
Calcium: 9.3 mg/dL (ref 8.9–10.3)
Chloride: 107 mmol/L (ref 98–111)
Creatinine, Ser: 1.14 mg/dL (ref 0.61–1.24)
GFR calc Af Amer: >60 mL/min (ref >60)
GFR calc non Af Amer: >60 mL/min (ref >60)
Glucose, Bld: 124 mg/dL — ABNORMAL HIGH (ref 70–99)
Potassium: 3.8 mmol/L (ref 3.5–5.1)
Sodium: 141 mmol/L (ref 135–145)
Total Bilirubin: 0.9 mg/dL (ref 0.3–1.2)
Total Protein: 7.7 g/dL (ref 6.5–8.1)

## 2019-10-16 LAB — LIPASE, BLOOD: Lipase: 37 U/L (ref 11–51)

## 2019-10-16 LAB — CBC
HCT: 45.7 % (ref 39.0–52.0)
Hemoglobin: 14.4 g/dL (ref 13.0–17.0)
MCH: 25.1 pg — ABNORMAL LOW (ref 26.0–34.0)
MCHC: 31.5 g/dL (ref 30.0–36.0)
MCV: 79.6 fL — ABNORMAL LOW (ref 80.0–100.0)
Platelets: 211 10*3/uL (ref 150–400)
RBC: 5.74 MIL/uL (ref 4.22–5.81)
RDW: 13.4 % (ref 11.5–15.5)
WBC: 7.6 10*3/uL (ref 4.0–10.5)
nRBC: 0 % (ref 0.0–0.2)

## 2019-10-16 MED ORDER — KETOROLAC TROMETHAMINE 30 MG/ML IJ SOLN
30.0000 mg | Freq: Once | INTRAMUSCULAR | Status: AC
  Administered 2019-10-16: 30 mg via INTRAVENOUS
  Filled 2019-10-16: qty 1

## 2019-10-16 MED ORDER — SODIUM CHLORIDE 0.9% FLUSH: 3.0000 mL | Freq: Once | INTRAVENOUS | Status: DC

## 2019-10-16 MED ORDER — OXYCODONE-ACETAMINOPHEN 5-325 MG PO TABS
1.0000 | ORAL_TABLET | ORAL | Status: DC | PRN
  Administered 2019-10-16: 1 via ORAL
  Filled 2019-10-16: qty 1

## 2019-10-16 MED ORDER — HYDROMORPHONE HCL 1 MG/ML IJ SOLN
1.0000 mg | Freq: Once | INTRAMUSCULAR | Status: AC
  Administered 2019-10-16: 23:00:00 1 mg via INTRAVENOUS
  Filled 2019-10-16: qty 1

## 2019-10-16 MED ORDER — ONDANSETRON HCL 4 MG/2ML IJ SOLN
4.0000 mg | Freq: Once | INTRAMUSCULAR | Status: AC
  Administered 2019-10-16: 23:00:00 4 mg via INTRAVENOUS
  Filled 2019-10-16: qty 2

## 2019-10-16 NOTE — ED Triage Notes (Addendum)
Pt presents to ED POV. Pt c/o RLQ pain. Pt says that it came on slowly and then worsened all the sudden. Pt reports that it came from his groin and goes up towards RLQ. Pt reports normal urination, denies burning. Denies bowel complaints. Pt dcry heaving and yelling in triage d/t pain. No emesis

## 2019-10-16 NOTE — ED Notes (Signed)
Reminded pt that we still need a UA, Pt stated they did not need to urinate at this time.

## 2019-10-16 NOTE — ED Notes (Signed)
[phone] Call when roomed

## 2019-10-16 NOTE — ED Provider Notes (Signed)
MSE was initiated and I personally evaluated the patient and placed orders (if any) at  9:54 PM on October 16, 2019.  I was asked to see patient to help with imaging. Patient had sudden onset RLQ/groin pain starting about 1 hour prior to me seeing him. On exam, no testicular tenderness/swelling. Some RLQ tenderness. Maybe some back pain. Will order CT renal stone study.  The patient appears stable so that the remainder of the MSE may be completed by another provider.   Pricilla Loveless, MD 10/16/19 2155
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
## 2019-10-17 MED ORDER — OXYCODONE-ACETAMINOPHEN 5-325 MG PO TABS: 1.0000 | ORAL_TABLET | ORAL | 0 refills | Status: DC | PRN

## 2019-10-17 MED ORDER — TAMSULOSIN HCL 0.4 MG PO CAPS: 0.4000 mg | ORAL_CAPSULE | Freq: Every day | ORAL | 0 refills | Status: DC

## 2019-10-17 MED ORDER — HYDROMORPHONE HCL 1 MG/ML IJ SOLN
0.5000 mg | Freq: Once | INTRAMUSCULAR | Status: AC
  Administered 2019-10-17: 0.5 mg via INTRAVENOUS
  Filled 2019-10-17: qty 1

## 2019-10-17 MED ORDER — ONDANSETRON 4 MG PO TBDP
ORAL_TABLET | ORAL | 0 refills | Status: DC
Start: 2019-10-17 — End: 2020-09-29

## 2020-04-27 ENCOUNTER — Other Ambulatory Visit: Payer: Self-pay | Admitting: Internal Medicine

## 2020-04-27 DIAGNOSIS — R110 Nausea: Secondary | ICD-10-CM

## 2020-04-28 ENCOUNTER — Ambulatory Visit
Admission: RE | Admit: 2020-04-28 | Discharge: 2020-04-28 | Disposition: A | Discharge status: Home / Self Care | Payer: No Typology Code available for payment source | Source: Ambulatory Visit | Attending: Internal Medicine | Admitting: Internal Medicine

## 2020-04-28 DIAGNOSIS — R110 Nausea: Secondary | ICD-10-CM

## 2020-06-07 ENCOUNTER — Ambulatory Visit: Payer: Self-pay | Admitting: Cardiology

## 2020-09-29 ENCOUNTER — Encounter: Payer: Self-pay | Admitting: Emergency Medicine

## 2020-09-29 ENCOUNTER — Ambulatory Visit
Admission: EM | Admit: 2020-09-29 | Discharge: 2020-09-29 | Disposition: A | Discharge status: Home / Self Care | Payer: No Typology Code available for payment source | Attending: Emergency Medicine | Admitting: Emergency Medicine

## 2020-09-29 ENCOUNTER — Other Ambulatory Visit: Payer: Self-pay

## 2020-09-29 DIAGNOSIS — L0231 Cutaneous abscess of buttock: Secondary | ICD-10-CM

## 2020-09-29 MED ORDER — DOXYCYCLINE HYCLATE 100 MG PO CAPS
100.0000 mg | ORAL_CAPSULE | Freq: Two times a day (BID) | ORAL | 0 refills | Status: AC
Start: 2020-09-29 — End: 2020-10-06

## 2020-09-29 MED ORDER — IBUPROFEN 800 MG PO TABS
800.0000 mg | ORAL_TABLET | Freq: Three times a day (TID) | ORAL | 0 refills | Status: AC
Start: 2020-09-29 — End: ?

## 2020-09-29 NOTE — Discharge Instructions (Addendum)
Begin doxycycline twice daily x 1 week Use anti-inflammatories for pain/swelling. You may take up to 800 mg Ibuprofen every 8 hours with food. You may supplement Ibuprofen with Tylenol [phone] mg every 8 hours.  Apply heat/warm compresses to area Please follow up with [facility] [place] [facility] or dermatology clinic with family medicine center for further evaluation
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
## 2020-09-29 NOTE — ED Triage Notes (Signed)
Patient presents to [facility] for evaluation of absess to left buttocks "for a long time", but has worsened in the past few weeks, growing in size.  Patient has had it opened and drained before, and has taken antibiotics in the past.

## 2020-10-28 ENCOUNTER — Ambulatory Visit (INDEPENDENT_AMBULATORY_CARE_PROVIDER_SITE_OTHER): Payer: Self-pay | Admitting: Family Medicine

## 2020-10-28 ENCOUNTER — Other Ambulatory Visit: Payer: Self-pay

## 2020-10-28 VITALS — BP 110/70 | HR 66 | Ht 71.0 in | Wt 208.0 lb

## 2020-10-28 DIAGNOSIS — L989 Disorder of the skin and subcutaneous tissue, unspecified: Secondary | ICD-10-CM

## 2020-10-28 NOTE — Progress Notes (Signed)
    SUBJECTIVE:   CHIEF COMPLAINT / HPI:   Skin lesion: 30 year old male presenting with a skin lesion on the left lateral buttock which has been present for several years.  He states that in the past it did have some drainage at 1 time and he went to the emergency department years ago and had it lanced with some purulent drainage.  He states that recently went to urgent care and also had it lanced but no drainage was present.  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  He states that sometimes it is bigger and sometimes it is smaller.  In the past he states it did have some drainage but none lately.  PERTINENT  PMH / PSH: None relevant  OBJECTIVE:   BP 110/70   Pulse 66   Ht 5\' 11"  (1.803 m)   Wt 208 lb (94.3 kg)   SpO2 98%   BMI 29.01 kg/m    General: NAD, pleasant, able to participate in exam Respiratory: No respiratory distress Skin: 5 mm lesion on the left buttock about 3 cm superior to the anus with small dimple present just medial to the lesion.  The lesion is firm but is tender to palpation.    ASSESSMENT/PLAN:   Skin lesion: Differential can include pilonidal cyst versus pararectal abscess.  The patient has had this lanced in the past but it continues to return.  He also has a small dimple just medial to the lesion superior to the anus which increases the concern for pilonidal cyst.  The lesion is not really consistent with a keloid due to its texture.  Recommended to the patient to get in with general surgery for concern of pilonidal cyst as he may need a significant procedure for this.  Have placed this referral and will send a note to his PCP.  I recommended patient reach out to his PCP if he does not hear from general surgery in the next 1 to 2 weeks.   , DO [facility]

## 2020-10-28 NOTE — Patient Instructions (Addendum)
Today we discussed the skin lesion that you came in with.  We have some concern that this may be a pilonidal cyst versus a perirectal abscess.  We are setting you up through a referral to general surgery to discuss this further.  They may need to do some imaging.  I recommend that you inform your primary care doctor.  We will also send a note to them as well.  The referral for your surgeon should have them reaching out to you within the next 2 weeks.  If they do not please let your primary doctor know.

## 2021-02-17 ENCOUNTER — Other Ambulatory Visit: Payer: Self-pay

## 2021-02-17 ENCOUNTER — Ambulatory Visit (INDEPENDENT_AMBULATORY_CARE_PROVIDER_SITE_OTHER): Payer: 59

## 2021-02-17 ENCOUNTER — Ambulatory Visit (HOSPITAL_COMMUNITY)
Admission: EM | Admit: 2021-02-17 | Discharge: 2021-02-17 | Disposition: A | Discharge status: Home / Self Care | Payer: 59 | Attending: Internal Medicine | Admitting: Internal Medicine

## 2021-02-17 ENCOUNTER — Encounter (HOSPITAL_COMMUNITY): Payer: Self-pay | Admitting: Emergency Medicine

## 2021-02-17 DIAGNOSIS — M25571 Pain in right ankle and joints of right foot: Secondary | ICD-10-CM | POA: Diagnosis not present

## 2021-02-17 DIAGNOSIS — S93401A Sprain of unspecified ligament of right ankle, initial encounter: Secondary | ICD-10-CM | POA: Diagnosis not present

## 2021-02-17 NOTE — ED Triage Notes (Signed)
Pt reports twisted right ankle two days ago. C/o pains, swelling and bruising to lateral ankle. Taking ibuprofen and cream for pain.  Came in with brace on ankle
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
## 2021-02-17 NOTE — Discharge Instructions (Signed)
Gentle range of motion exercises Ibuprofen as needed for pain X-rays negative for fracture Continue is wrap or ankle brace Return to urgent care if symptoms worsen.

## 2021-09-11 ENCOUNTER — Emergency Department (HOSPITAL_COMMUNITY): Payer: Commercial Managed Care - HMO

## 2021-09-11 ENCOUNTER — Encounter (HOSPITAL_COMMUNITY): Payer: Self-pay | Admitting: Pharmacy Technician

## 2021-09-11 ENCOUNTER — Other Ambulatory Visit: Payer: Self-pay

## 2021-09-11 ENCOUNTER — Emergency Department (HOSPITAL_COMMUNITY)
Admission: EM | Admit: 2021-09-11 | Discharge: 2021-09-11 | Disposition: A | Discharge status: Home / Self Care | Payer: Commercial Managed Care - HMO | Attending: Emergency Medicine | Admitting: Emergency Medicine

## 2021-09-11 DIAGNOSIS — M25532 Pain in left wrist: Secondary | ICD-10-CM | POA: Diagnosis not present

## 2021-09-11 DIAGNOSIS — Z23 Encounter for immunization: Secondary | ICD-10-CM | POA: Diagnosis not present

## 2021-09-11 DIAGNOSIS — S0091XA Abrasion of unspecified part of head, initial encounter: Secondary | ICD-10-CM | POA: Diagnosis not present

## 2021-09-11 DIAGNOSIS — M25562 Pain in left knee: Secondary | ICD-10-CM | POA: Diagnosis not present

## 2021-09-11 DIAGNOSIS — W19XXXA Unspecified fall, initial encounter: Secondary | ICD-10-CM

## 2021-09-11 DIAGNOSIS — M25512 Pain in left shoulder: Secondary | ICD-10-CM | POA: Diagnosis not present

## 2021-09-11 DIAGNOSIS — S0990XA Unspecified injury of head, initial encounter: Secondary | ICD-10-CM | POA: Diagnosis present

## 2021-09-11 DIAGNOSIS — W010XXA Fall on same level from slipping, tripping and stumbling without subsequent striking against object, initial encounter: Secondary | ICD-10-CM | POA: Diagnosis not present

## 2021-09-11 DIAGNOSIS — S40212A Abrasion of left shoulder, initial encounter: Secondary | ICD-10-CM | POA: Diagnosis not present

## 2021-09-11 LAB — BASIC METABOLIC PANEL
Anion gap: 9 (ref 5–15)
BUN: 22 mg/dL — ABNORMAL HIGH (ref 6–20)
CO2: 24 mmol/L (ref 22–32)
Calcium: 9.3 mg/dL (ref 8.9–10.3)
Chloride: 104 mmol/L (ref 98–111)
Creatinine, Ser: 1.04 mg/dL (ref 0.61–1.24)
GFR, Estimated: >60 mL/min (ref >60)
Glucose, Bld: 135 mg/dL — ABNORMAL HIGH (ref 70–99)
Potassium: 3.6 mmol/L (ref 3.5–5.1)
Sodium: 137 mmol/L (ref 135–145)

## 2021-09-11 LAB — CBC
HCT: 44.1 % (ref 39.0–52.0)
Hemoglobin: 14.5 g/dL (ref 13.0–17.0)
MCH: 26.6 pg (ref 26.0–34.0)
MCHC: 32.9 g/dL (ref 30.0–36.0)
MCV: 80.8 fL (ref 80.0–100.0)
Platelets: 182 10*3/uL (ref 150–400)
RBC: 5.46 MIL/uL (ref 4.22–5.81)
RDW: 13.6 % (ref 11.5–15.5)
WBC: 5.7 10*3/uL (ref 4.0–10.5)
nRBC: 0 % (ref 0.0–0.2)

## 2021-09-11 MED ORDER — OXYCODONE-ACETAMINOPHEN 5-325 MG PO TABS
1.0000 | ORAL_TABLET | Freq: Once | ORAL | Status: AC
  Administered 2021-09-11: 1 via ORAL
  Filled 2021-09-11: qty 1

## 2021-09-11 MED ORDER — TETANUS-DIPHTH-ACELL PERTUSSIS 5-2.5-18.5 LF-MCG/0.5 IM SUSY
0.5000 mL | PREFILLED_SYRINGE | Freq: Once | INTRAMUSCULAR | Status: AC
  Administered 2021-09-11: 0.5 mL via INTRAMUSCULAR
  Filled 2021-09-11: qty 0.5

## 2021-09-11 NOTE — ED Triage Notes (Addendum)
Pt here after mechanical fall, hitting head on concrete. Pt endorses possible LOC. Now with dizziness and headache. Denies blood thinners. Pt also with pain to L shoulder.

## 2021-10-03 IMAGING — US US ABDOMEN LIMITED RUQ/ASCITES
1 series · 14 of 25 positions shown · non-contrast
Comparison: 10/16/2019

CLINICAL DATA: Mid abdominal pain

EXAM:
ULTRASOUND ABDOMEN LIMITED RIGHT UPPER QUADRANT

[Series 1: us abdomen limited ruq/ascites · 0.20mm/px · 14 of 36 slices shown]
[im 1/36]
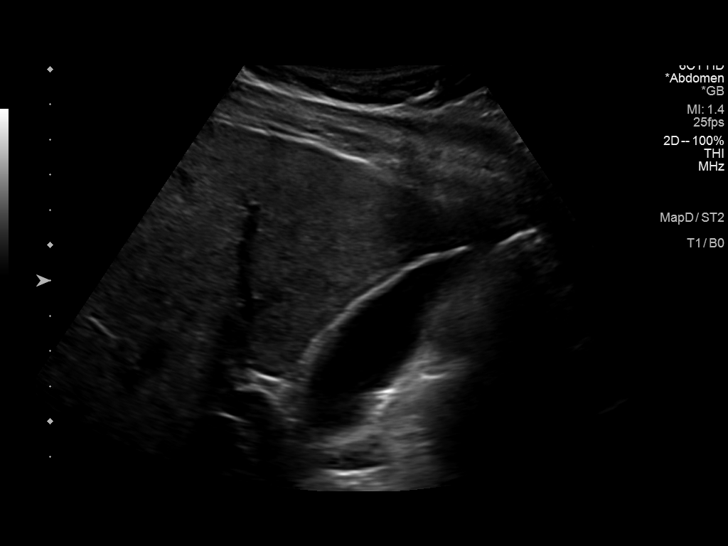
[im 3/36]
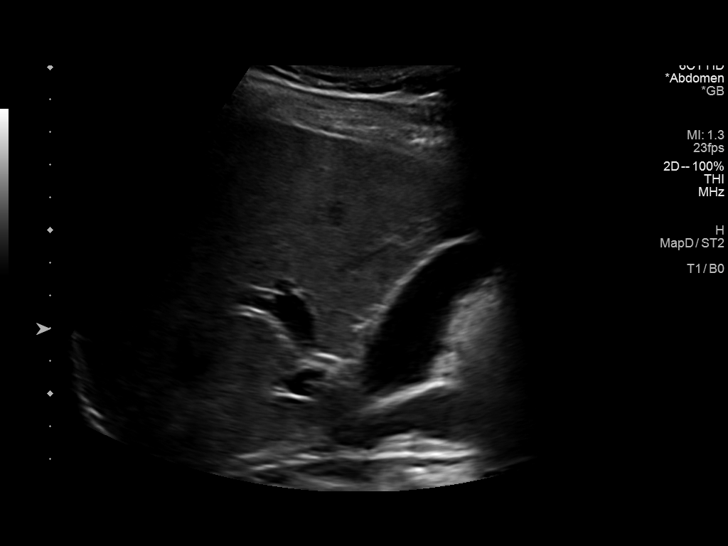
[im 6/36]
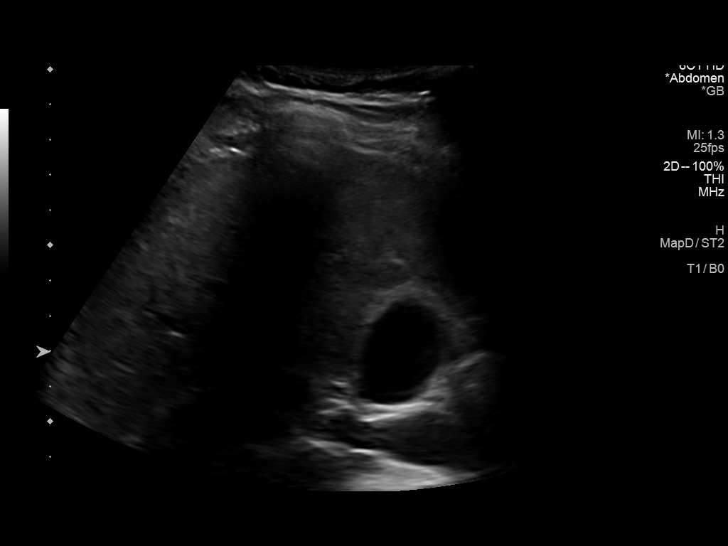
[im 9/36]
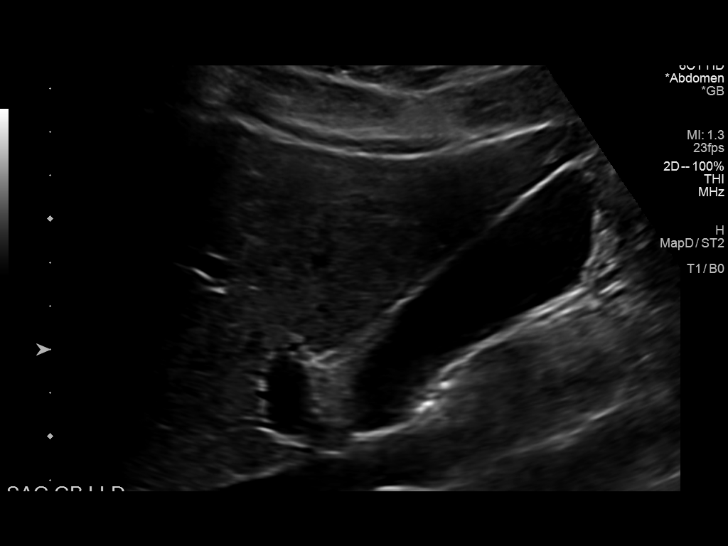
[im 12/36]
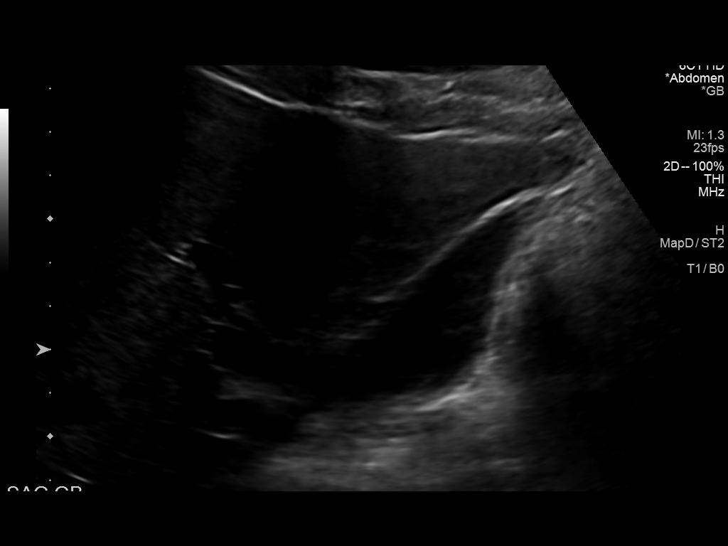
[im 14/36]
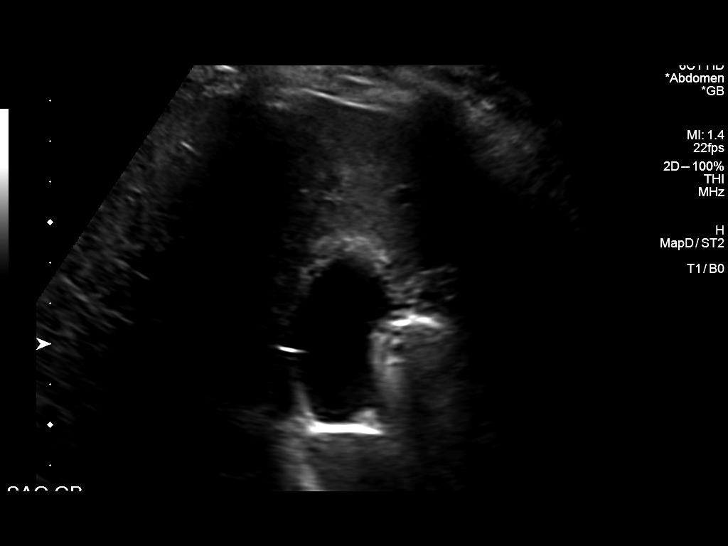
[im 17/36]
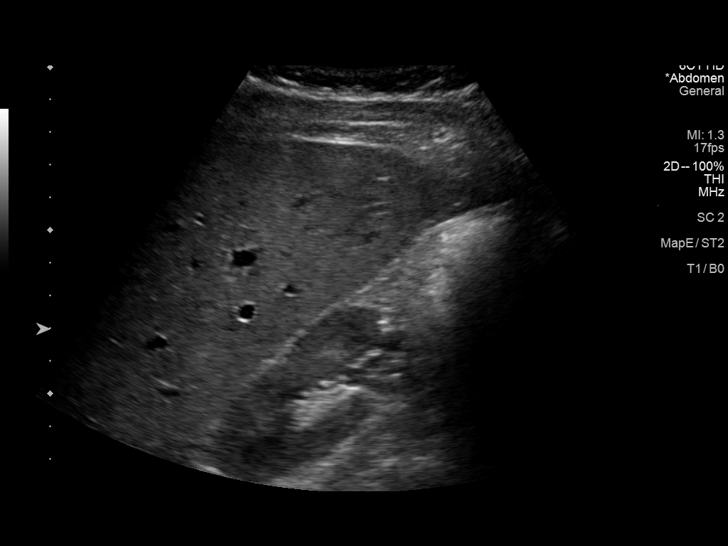
[im 19/36]
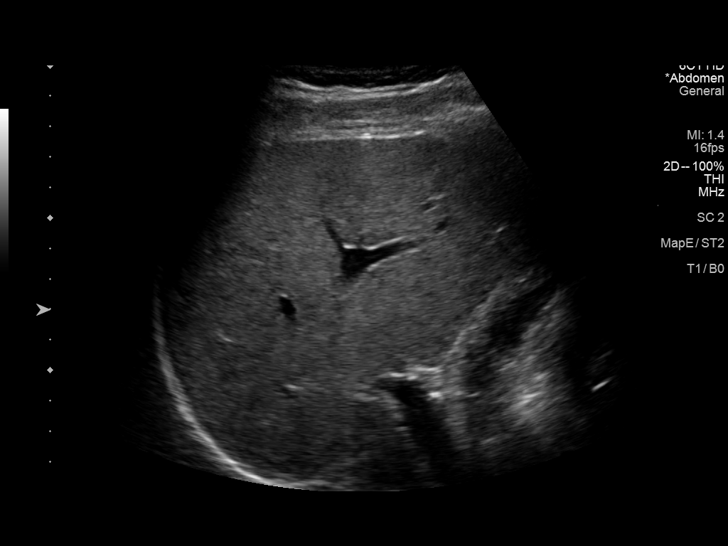
[im 22/36]
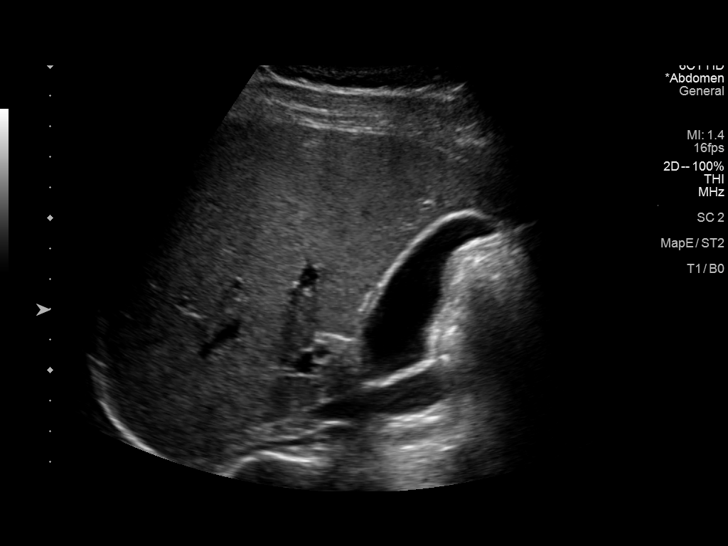
[im 24/36]
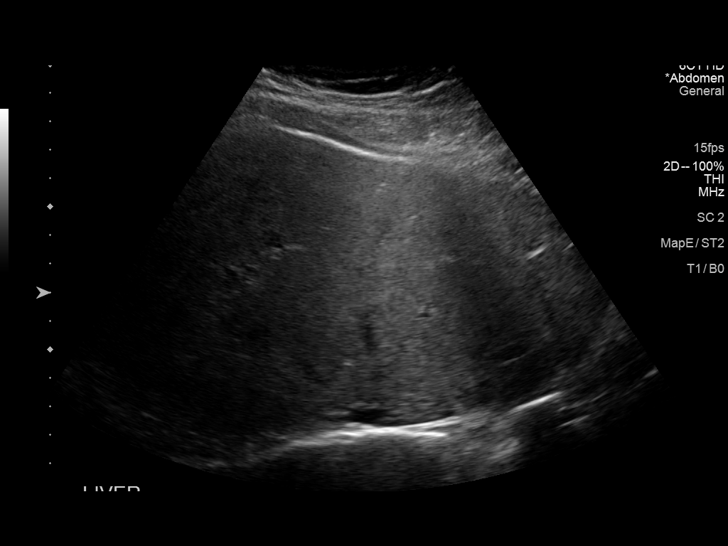
[im 27/36]
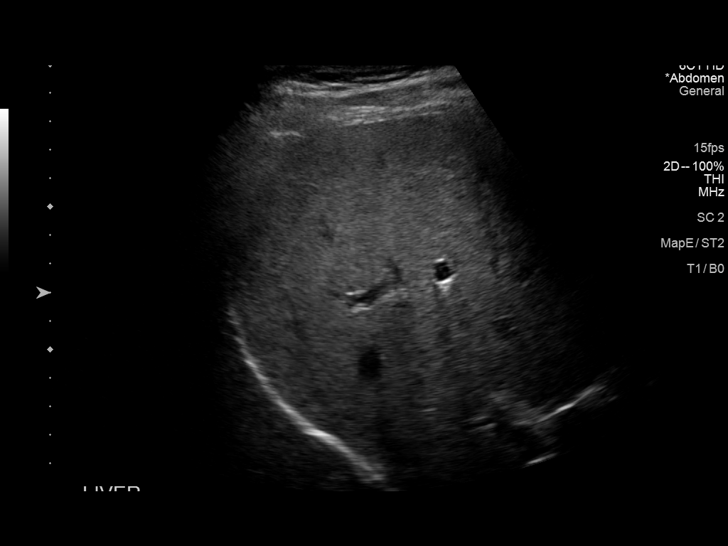
[im 30/36]
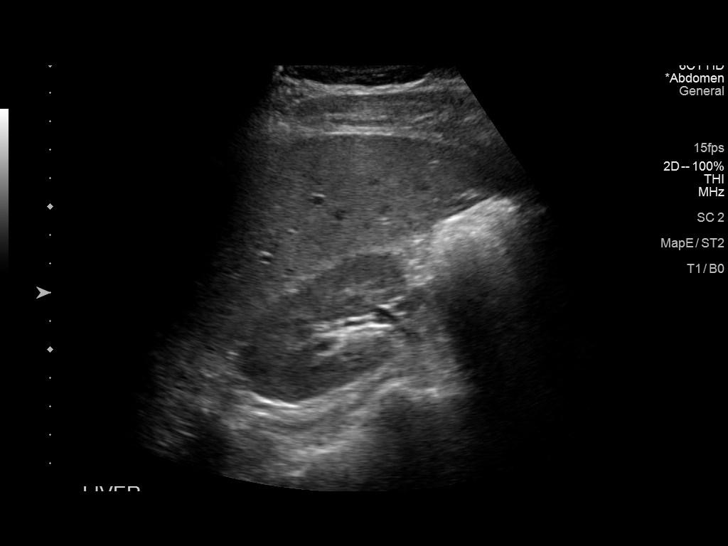
[im 33/36]
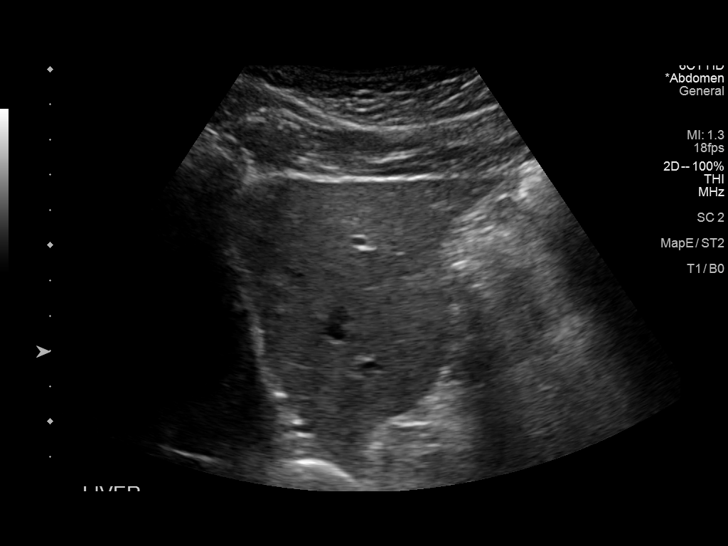
[im 36/36]
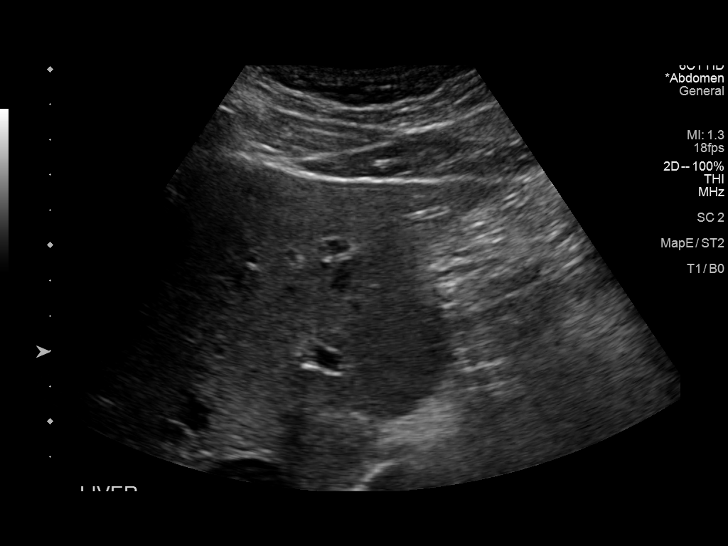

[14 of 25 positions shown; findings below may reference images not displayed]

FINDINGS: Gallbladder:

No gallstones or wall thickening visualized. No sonographic Murphy
sign noted by sonographer.

Common bile duct:

Diameter: 2.6 mm

Liver:

No focal lesion identified. Within normal limits in parenchymal
echogenicity. Portal vein is patent on color Doppler imaging with
normal direction of blood flow towards the liver.

Other: None.
IMPRESSION: No acute abnormality noted.

## 2021-12-27 ENCOUNTER — Ambulatory Visit
Admission: RE | Admit: 2021-12-27 | Discharge: 2021-12-27 | Disposition: A | Discharge status: Home / Self Care | Payer: Commercial Managed Care - HMO | Source: Ambulatory Visit | Attending: Family Medicine | Admitting: Family Medicine

## 2021-12-27 ENCOUNTER — Other Ambulatory Visit: Payer: Self-pay | Admitting: Family Medicine

## 2021-12-27 DIAGNOSIS — J209 Acute bronchitis, unspecified: Secondary | ICD-10-CM

## 2022-05-11 ENCOUNTER — Other Ambulatory Visit: Payer: Self-pay | Admitting: General Surgery

## 2022-05-11 DIAGNOSIS — N5089 Other specified disorders of the male genital organs: Secondary | ICD-10-CM

## 2022-05-23 ENCOUNTER — Ambulatory Visit
Admission: RE | Admit: 2022-05-23 | Discharge: 2022-05-23 | Disposition: A | Discharge status: Home / Self Care | Payer: Commercial Managed Care - HMO | Source: Ambulatory Visit | Attending: General Surgery | Admitting: General Surgery

## 2022-05-23 DIAGNOSIS — N5089 Other specified disorders of the male genital organs: Secondary | ICD-10-CM

## 2022-07-25 IMAGING — DX DG ANKLE COMPLETE 3+V*R*
3 series · 3 of 3 positions shown · non-contrast
Comparison: None.

CLINICAL DATA: Ankle pain and bruising.

EXAM:
RIGHT ANKLE - COMPLETE 3+ VIEW

[ankle ap]
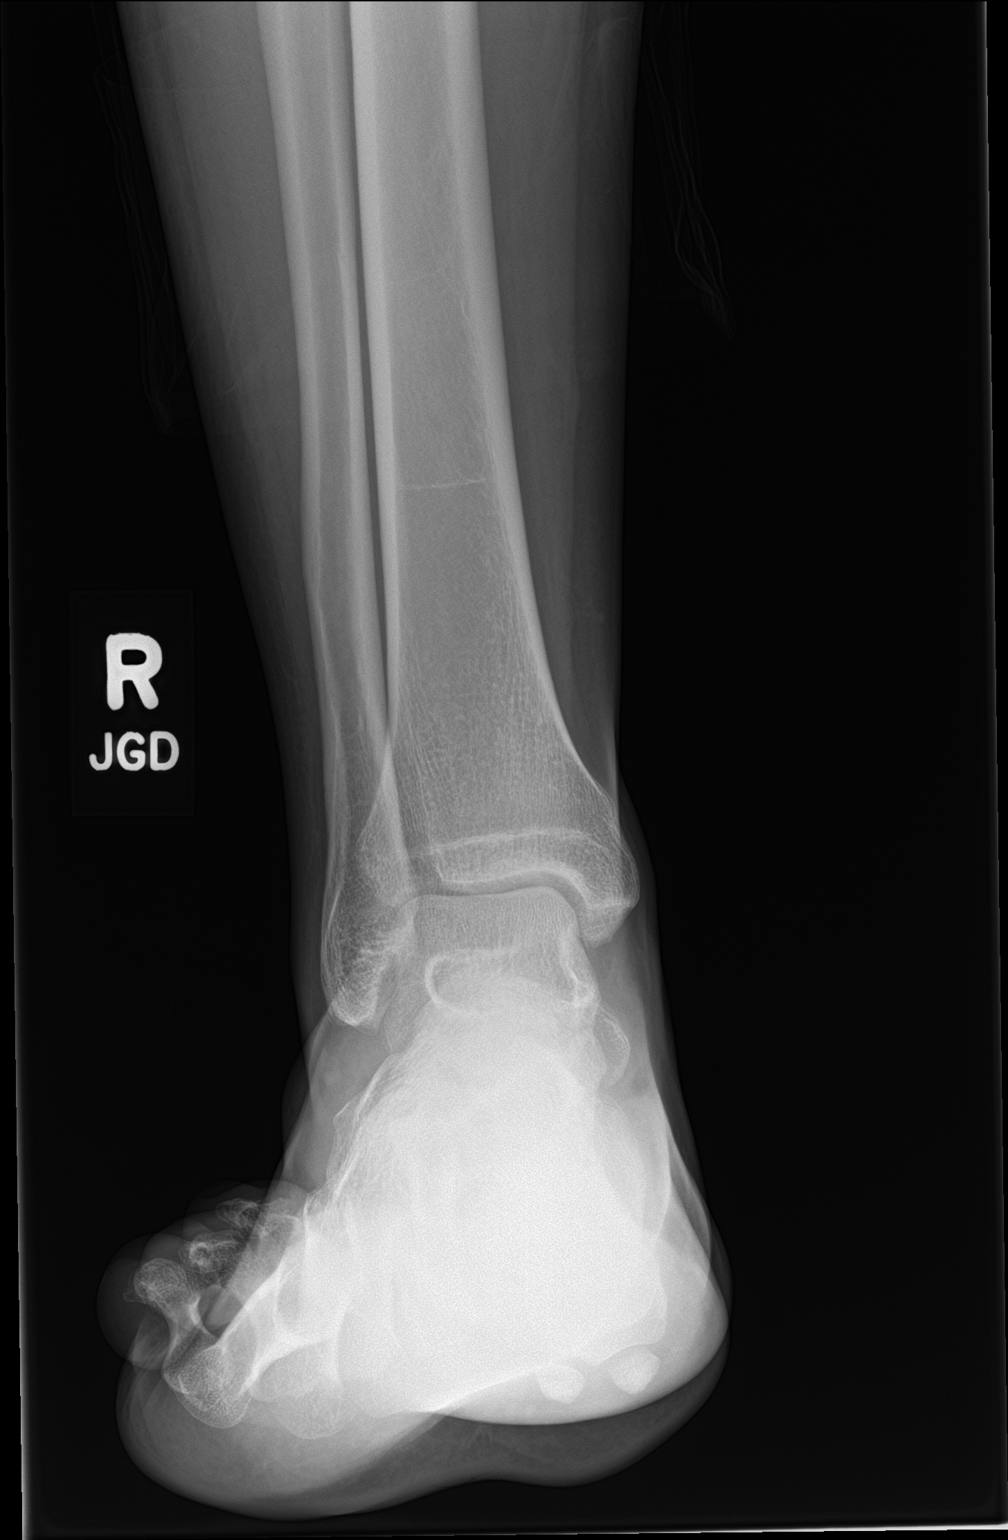

[ankle obl]
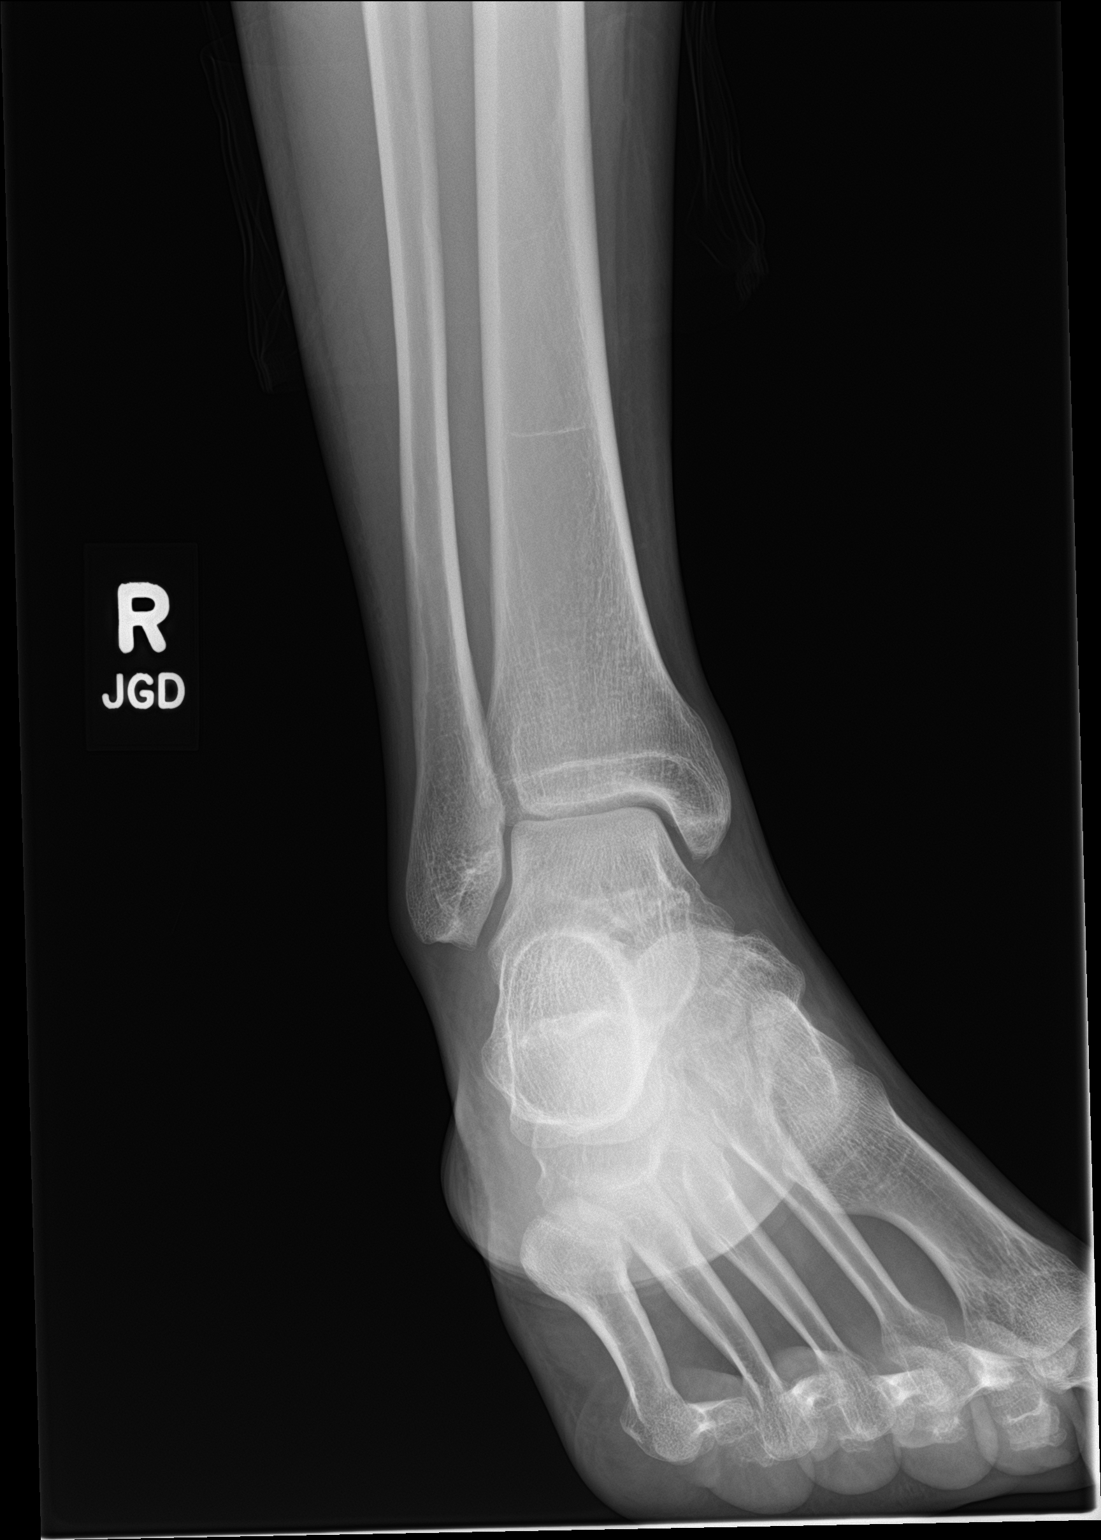

[ankle lat]
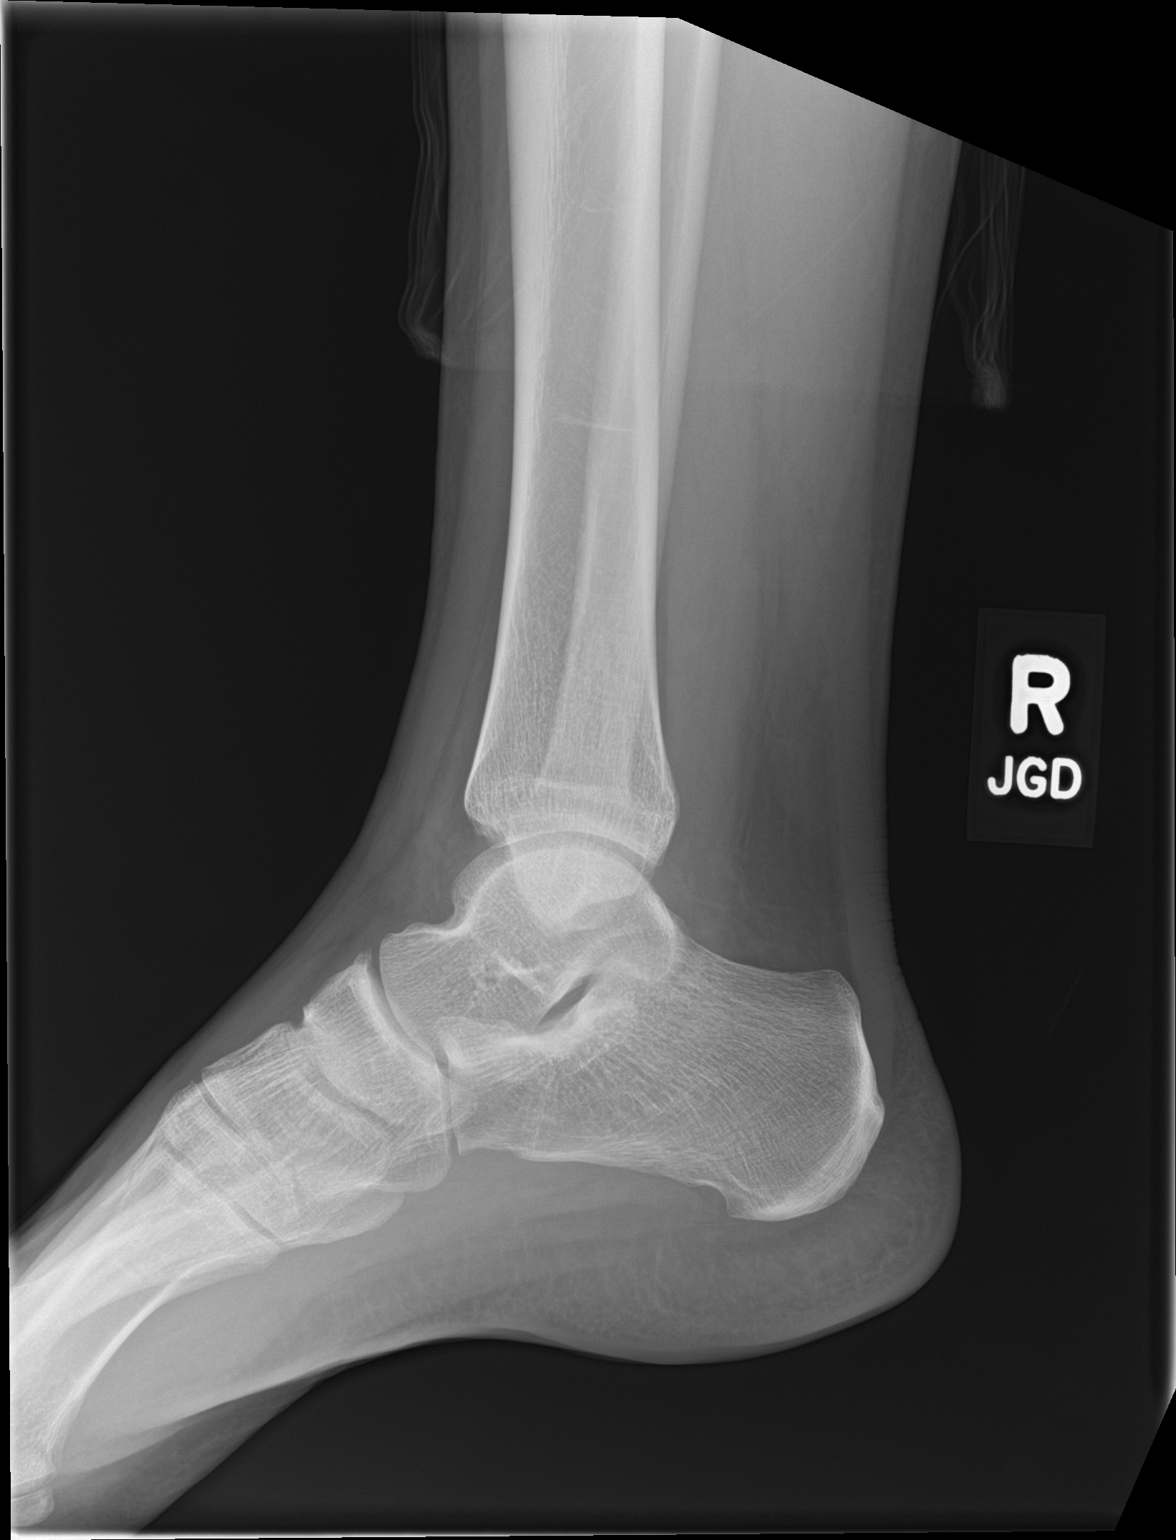

[3 of 3 positions shown; findings below may reference images not displayed]

FINDINGS: There is no evidence of fracture, dislocation, or joint effusion.
There is no evidence of arthropathy or other focal bone abnormality.
Soft tissues are unremarkable.
IMPRESSION: Negative.

## 2022-10-31 ENCOUNTER — Emergency Department (HOSPITAL_COMMUNITY)
Admission: EM | Admit: 2022-10-31 | Discharge: 2022-10-31 | Disposition: A | Discharge status: Home / Self Care | Payer: Commercial Managed Care - HMO | Attending: Emergency Medicine | Admitting: Emergency Medicine

## 2022-10-31 ENCOUNTER — Emergency Department (HOSPITAL_COMMUNITY): Payer: Commercial Managed Care - HMO

## 2022-10-31 ENCOUNTER — Encounter (HOSPITAL_COMMUNITY): Payer: Self-pay

## 2022-10-31 ENCOUNTER — Other Ambulatory Visit: Payer: Self-pay

## 2022-10-31 DIAGNOSIS — R002 Palpitations: Secondary | ICD-10-CM

## 2022-10-31 DIAGNOSIS — R000 Tachycardia, unspecified: Secondary | ICD-10-CM | POA: Insufficient documentation

## 2022-10-31 DIAGNOSIS — E876 Hypokalemia: Secondary | ICD-10-CM | POA: Diagnosis not present

## 2022-10-31 LAB — BASIC METABOLIC PANEL
Anion gap: 8 (ref 5–15)
BUN: 12 mg/dL (ref 6–20)
CO2: 24 mmol/L (ref 22–32)
Calcium: 8.5 mg/dL — ABNORMAL LOW (ref 8.9–10.3)
Chloride: 107 mmol/L (ref 98–111)
Creatinine, Ser: 0.91 mg/dL (ref 0.61–1.24)
GFR, Estimated: >60 mL/min (ref >60)
Glucose, Bld: 105 mg/dL — ABNORMAL HIGH (ref 70–99)
Potassium: 3.9 mmol/L (ref 3.5–5.1)
Sodium: 139 mmol/L (ref 135–145)

## 2022-10-31 LAB — COMPREHENSIVE METABOLIC PANEL
ALT: 23 U/L (ref 0–44)
AST: 23 U/L (ref 15–41)
Albumin: 4.4 g/dL (ref 3.5–5.0)
Alkaline Phosphatase: 52 U/L (ref 38–126)
Anion gap: 12 (ref 5–15)
BUN: 16 mg/dL (ref 6–20)
CO2: 24 mmol/L (ref 22–32)
Calcium: 9.1 mg/dL (ref 8.9–10.3)
Chloride: 102 mmol/L (ref 98–111)
Creatinine, Ser: 1.16 mg/dL (ref 0.61–1.24)
GFR, Estimated: >60 mL/min (ref >60)
Glucose, Bld: 142 mg/dL — ABNORMAL HIGH (ref 70–99)
Potassium: 2.9 mmol/L — ABNORMAL LOW (ref 3.5–5.1)
Sodium: 138 mmol/L (ref 135–145)
Total Bilirubin: 0.3 mg/dL (ref 0.3–1.2)
Total Protein: 6.9 g/dL (ref 6.5–8.1)

## 2022-10-31 LAB — CBC
HCT: 43.9 % (ref 39.0–52.0)
Hemoglobin: 14.2 g/dL (ref 13.0–17.0)
MCH: 25.4 pg — ABNORMAL LOW (ref 26.0–34.0)
MCHC: 32.3 g/dL (ref 30.0–36.0)
MCV: 78.7 fL — ABNORMAL LOW (ref 80.0–100.0)
Platelets: 198 10*3/uL (ref 150–400)
RBC: 5.58 MIL/uL (ref 4.22–5.81)
RDW: 13.3 % (ref 11.5–15.5)
WBC: 7.8 10*3/uL (ref 4.0–10.5)
nRBC: 0 % (ref 0.0–0.2)

## 2022-10-31 LAB — TROPONIN I (HIGH SENSITIVITY): Troponin I (High Sensitivity): 3 ng/L (ref <18)

## 2022-10-31 LAB — MAGNESIUM: Magnesium: 1.8 mg/dL (ref 1.7–2.4)

## 2022-10-31 LAB — D-DIMER, QUANTITATIVE: D-Dimer, Quant: 0.31 ug/mL-FEU (ref 0.00–0.50)

## 2022-10-31 MED ORDER — ACETAMINOPHEN 500 MG PO TABS
1000.0000 mg | ORAL_TABLET | Freq: Once | ORAL | Status: AC
  Administered 2022-10-31: 1000 mg via ORAL
  Filled 2022-10-31: qty 2

## 2022-10-31 MED ORDER — LACTATED RINGERS IV BOLUS
1000.0000 mL | Freq: Once | INTRAVENOUS | Status: AC
  Administered 2022-10-31: 1000 mL via INTRAVENOUS

## 2022-10-31 MED ORDER — MAGNESIUM SULFATE 2 GM/50ML IV SOLN
2.0000 g | Freq: Once | INTRAVENOUS | Status: DC
  Filled 2022-10-31: qty 50

## 2022-10-31 MED ORDER — POTASSIUM CHLORIDE 10 MEQ/100ML IV SOLN
10.0000 meq | INTRAVENOUS | Status: AC
  Administered 2022-10-31 (×2): 10 meq via INTRAVENOUS
  Filled 2022-10-31 (×2): qty 100

## 2022-10-31 MED ORDER — POTASSIUM CHLORIDE CRYS ER 20 MEQ PO TBCR
80.0000 meq | EXTENDED_RELEASE_TABLET | Freq: Once | ORAL | Status: AC
  Administered 2022-10-31: 80 meq via ORAL
  Filled 2022-10-31: qty 4

## 2022-10-31 NOTE — ED Provider Notes (Signed)
Care of the patient assumed at signout.  On signout patient was awaiting repeat labs after potassium supplementation was provided.  Repeat labs reassuring, patient in no distress, no ongoing complaints.   Gerhard Munch, MD 10/31/22 [phone]

## 2022-10-31 NOTE — ED Notes (Signed)
Patient transported to X-ray 

## 2022-10-31 NOTE — ED Provider Notes (Signed)
[facility] Provider Note   CSN: [id number] Arrival date & time: 10/31/22  0456     History {Add pertinent medical, surgical, social history, OB history to HPI:1} Chief Complaint  Patient presents with   Palpitations    Timothy Burton is a 32 y.o. male.  32 year old male who presents ER today secondary to palpitations.  Patient states that he was watching TV and felt his heart rate going fast.  He states he got as high as 148.  He took 4 tramadol and 2 sleeping pills and seem to getting better took some ibuprofen still drinking better so he presents here for further evaluation.  He has been a lot of stress recently related to the loss of a child. Decreased PO intake for the same reason. Right now feels a bit of heart pain and very sleepy. Not suicidal. No other medications or illicit drugs. Did have one beer tonight.    Palpitations      Home Medications Prior to Admission medications   Medication Sig Start Date End Date Taking? Authorizing Provider  ibuprofen (ADVIL) 800 MG tablet Take 1 tablet (800 mg total) by mouth 3 (three) times daily. 09/29/20   Wieters, Hallie C, PA-C      Allergies    Sulfa antibiotics    Review of Systems   Review of Systems  Cardiovascular:  Positive for palpitations.    Physical Exam Updated Vital Signs BP 134/82 (BP Location: Left Arm)   Pulse (!) 112   Temp 97.7 F (36.5 C) (Oral)   Resp 13   Ht 5\' 11"  (1.803 m)   Wt 90.7 kg   SpO2 100%   BMI 27.89 kg/m  Physical Exam Vitals and nursing note reviewed.  Constitutional:      Appearance: He is well-developed.     Comments: Sleepy on exam  HENT:     Head: Normocephalic and atraumatic.  Cardiovascular:     Rate and Rhythm: Tachycardia present.  Pulmonary:     Effort: Pulmonary effort is normal. No respiratory distress.  Abdominal:     General: There is no distension.  Musculoskeletal:        General: Normal range of motion.      Cervical back: Normal range of motion.  Neurological:     Mental Status: He is alert.     ED Results / Procedures / Treatments   Labs (all labs ordered are listed, but only abnormal results are displayed) Labs Reviewed  CBC - Abnormal; Notable for the following components:      Result Value   MCV 78.7 (*)    MCH 25.4 (*)    All other components within normal limits  COMPREHENSIVE METABOLIC PANEL - Abnormal; Notable for the following components:   Potassium 2.9 (*)    Glucose, Bld 142 (*)    All other components within normal limits  MAGNESIUM  D-DIMER, QUANTITATIVE  TROPONIN I (HIGH SENSITIVITY)    EKG None  Radiology DG Chest 2 View  Result Date: 10/31/2022 CLINICAL DATA:  Encounter for chest pain EXAM: CHEST - 2 VIEW COMPARISON:  12/27/2021 FINDINGS: Artifact from EKG leads. Normal heart size and mediastinal contours. No acute infiltrate or edema. No effusion or pneumothorax. No acute osseous findings. IMPRESSION: No active cardiopulmonary disease. Electronically Signed   By: Tiburcio Pea M.D.   On: 10/31/2022 06:10    Procedures Procedures  {Document cardiac monitor, telemetry assessment procedure when appropriate:1}  Medications Ordered in ED  Medications  acetaminophen (TYLENOL) tablet 1,000 mg (has no administration in time range)  lactated ringers bolus 1,000 mL (has no administration in time range)  lactated ringers bolus 1,000 mL (1,000 mLs Intravenous New Bag/Given 10/31/22 0527)    ED Course/ Medical Decision Making/ A&P   {   Click here for ABCD2, HEART and other calculatorsREFRESH Note before signing :1}                              Medical Decision Making Amount and/or Complexity of Data Reviewed Labs: ordered. Radiology: ordered.  Risk OTC drugs.  Patient with sinus tachycardia.  Is have some chest pressure so checked a troponin and this was negative.  EKG without ischemic changes.  D-dimer pending.  Was hypokalemic so we will replete with IV  and oral potassium along with fluids and magnesium pending improvement in his symptoms and metabolizing his tramadol and sleep medicine.  No evidence of serotonin syndrome or anticholinergic.  Will Need to Monitor until Patient Mental Status Improves and Then Can Be Stable for Discharge with PCP Follow-Up for Recheck Potassium. ***  {Document critical care time when appropriate:1} {Document review of labs and clinical decision tools ie heart score, Chads2Vasc2 etc:1}  {Document your independent review of radiology images, and any outside records:1} {Document your discussion with family members, caretakers, and with consultants:1} {Document social determinants of health affecting pt's care:1} {Document your decision making why or why not admission, treatments were needed:1} Final Clinical Impression(s) / ED Diagnoses Final diagnoses:  None    Rx / DC Orders ED Discharge Orders     None

## 2022-10-31 NOTE — ED Notes (Signed)
2nd bag of IV potassium running with carrier fluids at this time

## 2022-10-31 NOTE — Discharge Instructions (Signed)
Today's evaluation has been reassuring.  However, with your episode of palpitations, and your low potassium value it is important to schedule follow-up with your physician for repeat evaluation within 1 week.  Return here for concerning changes in your condition.

## 2022-10-31 NOTE — ED Triage Notes (Signed)
Patient BIB [facility] from home due to palpitations and tachycardia. Patient states he and wife were watching a movie and it randomly occurred. Stated it lasted an hour prior to him calling EMS. Patient took ibuprofen and tamadol. Patient states pain is 8/10 in central chest. No history of such. Patient is A&Ox4. EMS reports highest HR got was 148 but went down shortly after.

## 2022-11-22 ENCOUNTER — Other Ambulatory Visit: Payer: Self-pay | Admitting: Family Medicine

## 2022-11-22 DIAGNOSIS — R7989 Other specified abnormal findings of blood chemistry: Secondary | ICD-10-CM

## 2022-12-05 ENCOUNTER — Ambulatory Visit
Admission: RE | Admit: 2022-12-05 | Discharge: 2022-12-05 | Disposition: A | Discharge status: Home / Self Care | Payer: Commercial Managed Care - HMO | Source: Ambulatory Visit | Attending: Family Medicine | Admitting: Family Medicine

## 2022-12-05 DIAGNOSIS — R7989 Other specified abnormal findings of blood chemistry: Secondary | ICD-10-CM

## 2023-01-26 ENCOUNTER — Other Ambulatory Visit: Payer: Self-pay | Admitting: Family Medicine

## 2023-01-26 DIAGNOSIS — G44209 Tension-type headache, unspecified, not intractable: Secondary | ICD-10-CM

## 2023-02-08 ENCOUNTER — Encounter: Payer: Self-pay | Admitting: Family Medicine

## 2023-02-14 ENCOUNTER — Encounter: Payer: Self-pay | Admitting: Internal Medicine

## 2023-02-19 ENCOUNTER — Other Ambulatory Visit: Payer: Self-pay | Admitting: Family Medicine

## 2023-02-19 DIAGNOSIS — G44209 Tension-type headache, unspecified, not intractable: Secondary | ICD-10-CM

## 2023-02-20 ENCOUNTER — Other Ambulatory Visit: Payer: Commercial Managed Care - HMO

## 2023-03-01 ENCOUNTER — Encounter: Payer: Self-pay | Admitting: Family Medicine

## 2023-03-06 ENCOUNTER — Ambulatory Visit
Admission: RE | Admit: 2023-03-06 | Discharge: 2023-03-06 | Disposition: A | Discharge status: Home / Self Care | Payer: Commercial Managed Care - HMO | Source: Ambulatory Visit | Attending: Family Medicine | Admitting: Family Medicine

## 2023-03-06 DIAGNOSIS — G44209 Tension-type headache, unspecified, not intractable: Secondary | ICD-10-CM

## 2023-12-04 ENCOUNTER — Encounter (INDEPENDENT_AMBULATORY_CARE_PROVIDER_SITE_OTHER): Payer: Self-pay

## 2023-12-27 ENCOUNTER — Institutional Professional Consult (permissible substitution) (INDEPENDENT_AMBULATORY_CARE_PROVIDER_SITE_OTHER): Admitting: Otolaryngology

## 2023-12-31 ENCOUNTER — Other Ambulatory Visit: Payer: Self-pay | Admitting: Family Medicine

## 2023-12-31 ENCOUNTER — Ambulatory Visit
Admission: RE | Admit: 2023-12-31 | Discharge: 2023-12-31 | Disposition: A | Discharge status: Home / Self Care | Source: Ambulatory Visit | Attending: Family Medicine | Admitting: Family Medicine

## 2023-12-31 DIAGNOSIS — M25562 Pain in left knee: Secondary | ICD-10-CM

## 2024-01-01 ENCOUNTER — Institutional Professional Consult (permissible substitution) (INDEPENDENT_AMBULATORY_CARE_PROVIDER_SITE_OTHER): Admitting: Otolaryngology

## 2024-01-03 ENCOUNTER — Encounter (INDEPENDENT_AMBULATORY_CARE_PROVIDER_SITE_OTHER): Payer: Self-pay | Admitting: Otolaryngology

## 2024-01-03 ENCOUNTER — Encounter (INDEPENDENT_AMBULATORY_CARE_PROVIDER_SITE_OTHER): Payer: Self-pay

## 2024-01-03 ENCOUNTER — Ambulatory Visit (INDEPENDENT_AMBULATORY_CARE_PROVIDER_SITE_OTHER): Admitting: Otolaryngology

## 2024-01-03 VITALS — BP 114/75 | HR 66

## 2024-01-03 DIAGNOSIS — R0981 Nasal congestion: Secondary | ICD-10-CM | POA: Diagnosis not present

## 2024-01-03 DIAGNOSIS — R432 Parageusia: Secondary | ICD-10-CM

## 2024-01-03 DIAGNOSIS — J329 Chronic sinusitis, unspecified: Secondary | ICD-10-CM

## 2024-01-03 DIAGNOSIS — J343 Hypertrophy of nasal turbinates: Secondary | ICD-10-CM

## 2024-01-03 DIAGNOSIS — J3489 Other specified disorders of nose and nasal sinuses: Secondary | ICD-10-CM

## 2024-01-03 DIAGNOSIS — J342 Deviated nasal septum: Secondary | ICD-10-CM | POA: Diagnosis not present

## 2024-01-03 DIAGNOSIS — J3089 Other allergic rhinitis: Secondary | ICD-10-CM

## 2024-01-03 DIAGNOSIS — R430 Anosmia: Secondary | ICD-10-CM | POA: Diagnosis not present

## 2024-01-03 DIAGNOSIS — R0982 Postnasal drip: Secondary | ICD-10-CM

## 2024-01-03 MED ORDER — FLUTICASONE PROPIONATE 50 MCG/ACT NA SUSP: 2.0000 | Freq: Every day | NASAL | 6 refills | Status: AC

## 2024-01-03 MED ORDER — LEVOCETIRIZINE DIHYDROCHLORIDE 5 MG PO TABS: 5.0000 mg | ORAL_TABLET | Freq: Every evening | ORAL | 3 refills | Status: AC

## 2024-01-03 NOTE — Progress Notes (Signed)
 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    He has experienced a loss of smell and taste for over five years. He reports chronic nasal congestion but has no history of sinus infections requiring antibiotics. He has not undergone allergy testing.  He uses Flonase  daily without relief.   A CT head last year reportedly showed mucosal thickening in the sinuses. He has no history of nasal surgery but underwent a tonsillectomy in 2009.  He experiences daily headaches severe enough to warrant a CT scan of the head. He describes alternating nasal blockage, particularly when lying on his back.   Records Reviewed:  Seen in ED for palpitations 10/31/22 33 year old male who presents ER today secondary to palpitations. Patient states that he was watching TV and felt his heart rate going fast. He states he got as high as 148. He took 4 tramadol  and 2 sleeping pills and seem to getting better took some ibuprofen  still drinking better so he presents here for further evaluation. He has been a lot of stress recently related to the loss of a child. Decreased PO intake for the same reason. Right now feels a bit of heart pain and very sleepy. Not suicidal. No other medications or illicit drugs. Did have one beer tonight.    History reviewed. No pertinent past medical history.  Past Surgical History:  Procedure Laterality Date   FOOT SURGERY  1998   right toe   TONSILLECTOMY  2003    Family History  Problem Relation Age of Onset   Healthy Mother    Healthy Father     Social History:  reports that he has never smoked. He has never used smokeless tobacco. He reports  that he does not drink alcohol and does not use drugs.  Allergies:  Allergies  Allergen Reactions   Amoxicillin Dermatitis, Other (See Comments) and Swelling   Doxycycline  Swelling   Sulfa  Antibiotics Hives    Medications: I have reviewed the patient's current medications.  The PMH, PSH, Medications, Allergies, and SH were reviewed and updated.  ROS: Constitutional: Negative for fever, weight loss and weight gain. Cardiovascular: Negative for chest pain and dyspnea on exertion. Respiratory: Is not experiencing shortness of breath at rest. Gastrointestinal: Negative for nausea and vomiting. Neurological: Negative for headaches. Psychiatric: The patient is not nervous/anxious  Blood pressure 114/75, pulse 66, SpO2 97%. There is no height or weight on file to calculate BMI.  PHYSICAL EXAM:  Exam: General: Well-developed, well-nourished Respiratory Respiratory effort: Equal inspiration and expiration without stridor Cardiovascular Peripheral Vascular: Warm extremities with equal color/perfusion Eyes: No nystagmus with equal extraocular motion bilaterally Neuro/Psych/Balance: Patient oriented to person, place, and time; Appropriate mood and affect; Gait is intact with no imbalance; Cranial nerves I-XII are intact Head and Face Inspection: Normocephalic and atraumatic without mass or lesion Palpation: Facial skeleton intact without bony stepoffs Salivary Glands: No mass or tenderness Facial Strength: Facial motility symmetric and full bilaterally ENT Pinna: External ear intact and fully developed External canal: Canal is patent with intact skin Tympanic Membrane: Clear and mobile External Nose: No scar or anatomic deformity Internal Nose: Septum is S-shaped and deviated with significant narrowing of L >  R nasal passages. No polyp, or purulence. Mucosal edema and erythema present.  Bilateral inferior turbinate hypertrophy.  Lips, Teeth, and gums: Mucosa and teeth intact and  viable TMJ: No pain to palpation with full mobility Oral cavity/oropharynx: No erythema or exudate, no lesions present Nasopharynx: No mass or lesion with intact mucosa Hypopharynx: Intact mucosa without pooling of secretions Larynx Glottic: Full true vocal cord mobility without lesion or mass Supraglottic: Normal appearing epiglottis and AE folds Interarytenoid Space: Moderate pachydermia&edema Subglottic Space: Patent without lesion or edema Neck Neck and Trachea: Midline trachea without mass or lesion Thyroid: No mass or nodularity Lymphatics: No lymphadenopathy  Procedure:   PROCEDURE NOTE: nasal endoscopy  Preoperative diagnosis: chronic nasal obstruction symptoms  Postoperative diagnosis: same  Procedure: Diagnostic nasal endoscopy ([id number])  Surgeon: Elena Larry, M.D.  Anesthesia: Topical lidocaine  and Afrin  H&P REVIEW: The patient's history and physical were reviewed today prior to procedure. All medications were reviewed and updated as well. Complications: None Condition is stable throughout exam Indications and consent: The patient presents with symptoms of chronic sinusitis not responding to previous therapies. All the risks, benefits, and potential complications were reviewed with the patient preoperatively and informed consent was obtained. The time out was completed with confirmation of the correct procedure.   Procedure: The patient was seated upright in the clinic. Topical lidocaine  and Afrin were applied to the nasal cavity. After adequate anesthesia had occurred, the rigid nasal endoscope was passed into the nasal cavity. The nasal mucosa, turbinates, septum, and sinus drainage pathways were visualized bilaterally. This revealed no purulence or significant secretions that might be cultured. There were no polyps or sites of significant inflammation. The mucosa was intact and there was no crusting present. The scope was then slowly withdrawn and the patient  tolerated the procedure well. There were no complications or blood loss.   Studies Reviewed: CT head done for headache 03/06/23   Assessment/Plan: Encounter Diagnoses  Name Primary?   Chronic sinusitis, unspecified location Yes   Chronic nasal congestion    Environmental and seasonal allergies    Post-nasal drip    Hypertrophy of both inferior nasal turbinates    Nasal septal deviation    Nasal obstruction     Assessment and Plan Assessment & Plan Chronic nasal obstruction and nasal congestion Nasal deformity Nasal endoscopy with significant narrowing of both nasal passages. He also desires to address dorsal hump and overall nasal deformity. I discussed that he will require functional septorhinoplasty to address both.  Suspected chronic sinusitis with deviated nasal septum and suspected allergic rhinitis CT head 2024 showed mild sinus mucosal thickening and septal deviation. Emphasized allergy management post-surgery. Allergy symptoms may persist post-septoplasty. We also discussed obtaining CT max/face to rule out CRS. - Ordered CT max/face - Refer to allergist for allergy testing. 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 - Prescribed Flonase  nasal spray twice daily.      Thank you for allowing me to participate in the care of this patient. Please do not hesitate to contact me with any questions or concerns.   Elena Larry, MD Otolaryngology [facility] Phone: [phone] Fax: [phone]    01/03/2024, 1:00 PM

## 2024-01-15 ENCOUNTER — Ambulatory Visit (HOSPITAL_COMMUNITY)
Admission: RE | Admit: 2024-01-15 | Discharge: 2024-01-15 | Disposition: A | Discharge status: Home / Self Care | Source: Ambulatory Visit | Attending: Otolaryngology | Admitting: Otolaryngology

## 2024-01-15 DIAGNOSIS — J329 Chronic sinusitis, unspecified: Secondary | ICD-10-CM | POA: Insufficient documentation
